# Patient Record
Sex: Male | Born: 1970 | Race: White | Hispanic: No | Smoking: Never smoker
Health system: Southern US, Community
[De-identification: ages and names within clinical notes are randomized; demographics above are authoritative.]

## PROBLEM LIST (undated history)

## (undated) DIAGNOSIS — K5090 Crohn's disease, unspecified, without complications: Secondary | ICD-10-CM

## (undated) DIAGNOSIS — G4730 Sleep apnea, unspecified: Secondary | ICD-10-CM

## (undated) DIAGNOSIS — H469 Unspecified optic neuritis: Secondary | ICD-10-CM

## (undated) DIAGNOSIS — T7840XA Allergy, unspecified, initial encounter: Secondary | ICD-10-CM

## (undated) DIAGNOSIS — K635 Polyp of colon: Secondary | ICD-10-CM

## (undated) DIAGNOSIS — F419 Anxiety disorder, unspecified: Secondary | ICD-10-CM

## (undated) DIAGNOSIS — I2699 Other pulmonary embolism without acute cor pulmonale: Secondary | ICD-10-CM

## (undated) HISTORY — PX: COLONOSCOPY: SHX174

## (undated) HISTORY — PX: POLYPECTOMY: SHX149

## (undated) HISTORY — DX: Unspecified optic neuritis: H46.9

## (undated) HISTORY — DX: Crohn's disease, unspecified, without complications: K50.90

## (undated) HISTORY — DX: Sleep apnea, unspecified: G47.30

## (undated) HISTORY — DX: Polyp of colon: K63.5

## (undated) HISTORY — DX: Allergy, unspecified, initial encounter: T78.40XA

## (undated) HISTORY — DX: Anxiety disorder, unspecified: F41.9

## (undated) HISTORY — PX: OTHER SURGICAL HISTORY: SHX169

## (undated) HISTORY — DX: Other pulmonary embolism without acute cor pulmonale: I26.99

---

## 2000-09-21 ENCOUNTER — Other Ambulatory Visit: Admission: RE | Admit: 2000-09-21 | Discharge: 2000-09-21 | Payer: Self-pay | Admitting: Internal Medicine

## 2000-09-21 ENCOUNTER — Encounter (INDEPENDENT_AMBULATORY_CARE_PROVIDER_SITE_OTHER): Payer: Self-pay | Admitting: Specialist

## 2000-10-18 ENCOUNTER — Ambulatory Visit (HOSPITAL_COMMUNITY): Admission: RE | Admit: 2000-10-18 | Discharge: 2000-10-18 | Payer: Self-pay | Admitting: Internal Medicine

## 2000-10-18 ENCOUNTER — Encounter: Payer: Self-pay | Admitting: Internal Medicine

## 2004-03-07 ENCOUNTER — Ambulatory Visit: Payer: Self-pay | Admitting: Family Medicine

## 2004-03-28 ENCOUNTER — Ambulatory Visit: Payer: Self-pay | Admitting: Family Medicine

## 2004-05-12 ENCOUNTER — Ambulatory Visit: Payer: Self-pay | Admitting: Family Medicine

## 2004-05-26 ENCOUNTER — Ambulatory Visit: Payer: Self-pay | Admitting: Family Medicine

## 2004-06-11 ENCOUNTER — Ambulatory Visit: Payer: Self-pay | Admitting: Family Medicine

## 2004-10-10 ENCOUNTER — Ambulatory Visit: Payer: Self-pay | Admitting: Family Medicine

## 2004-12-05 ENCOUNTER — Ambulatory Visit: Payer: Self-pay | Admitting: Family Medicine

## 2005-10-28 ENCOUNTER — Ambulatory Visit: Payer: Self-pay | Admitting: Internal Medicine

## 2007-05-31 ENCOUNTER — Encounter: Admission: RE | Admit: 2007-05-31 | Discharge: 2007-05-31 | Payer: Self-pay | Admitting: Family Medicine

## 2007-12-30 ENCOUNTER — Encounter: Admission: RE | Admit: 2007-12-30 | Discharge: 2007-12-30 | Payer: Self-pay | Admitting: Internal Medicine

## 2008-03-02 DIAGNOSIS — I2699 Other pulmonary embolism without acute cor pulmonale: Secondary | ICD-10-CM

## 2008-03-02 HISTORY — DX: Other pulmonary embolism without acute cor pulmonale: I26.99

## 2010-06-17 ENCOUNTER — Telehealth: Payer: Self-pay | Admitting: Internal Medicine

## 2010-06-17 ENCOUNTER — Emergency Department (HOSPITAL_COMMUNITY)
Admission: EM | Admit: 2010-06-17 | Discharge: 2010-06-17 | Disposition: A | Discharge status: Home / Self Care | Payer: Managed Care, Other (non HMO) | Attending: Emergency Medicine | Admitting: Emergency Medicine

## 2010-06-17 DIAGNOSIS — R109 Unspecified abdominal pain: Secondary | ICD-10-CM | POA: Insufficient documentation

## 2010-06-17 DIAGNOSIS — K5090 Crohn's disease, unspecified, without complications: Secondary | ICD-10-CM | POA: Insufficient documentation

## 2010-06-17 DIAGNOSIS — Z7901 Long term (current) use of anticoagulants: Secondary | ICD-10-CM | POA: Insufficient documentation

## 2010-06-17 DIAGNOSIS — Z86718 Personal history of other venous thrombosis and embolism: Secondary | ICD-10-CM | POA: Insufficient documentation

## 2010-06-17 LAB — URINALYSIS, ROUTINE W REFLEX MICROSCOPIC
Bilirubin Urine: NEGATIVE
Glucose, UA: NEGATIVE mg/dL
Hgb urine dipstick: NEGATIVE
Ketones, ur: NEGATIVE mg/dL
Nitrite: NEGATIVE
Protein, ur: NEGATIVE mg/dL
Specific Gravity, Urine: 1.017 (ref 1.005–1.030)
Urobilinogen, UA: 0.2 mg/dL (ref 0.0–1.0)
pH: 6 (ref 5.0–8.0)

## 2010-06-17 LAB — CBC
HCT: 48.7 % (ref 39.0–52.0)
Hemoglobin: 16.3 g/dL (ref 13.0–17.0)
MCH: 28 pg (ref 26.0–34.0)
MCHC: 33.5 g/dL (ref 30.0–36.0)
MCV: 83.5 fL (ref 78.0–100.0)
Platelets: 181 10*3/uL (ref 150–400)
RBC: 5.83 MIL/uL — ABNORMAL HIGH (ref 4.22–5.81)
RDW: 13.4 % (ref 11.5–15.5)
WBC: 8.4 10*3/uL (ref 4.0–10.5)

## 2010-06-17 LAB — DIFFERENTIAL
Basophils Absolute: 0 10*3/uL (ref 0.0–0.1)
Basophils Relative: 0 % (ref 0–1)
Eosinophils Absolute: 0 10*3/uL (ref 0.0–0.7)
Eosinophils Relative: 0 % (ref 0–5)
Lymphocytes Relative: 13 % (ref 12–46)
Lymphs Abs: 1.1 10*3/uL (ref 0.7–4.0)
Monocytes Absolute: 0.4 10*3/uL (ref 0.1–1.0)
Monocytes Relative: 5 % (ref 3–12)
Neutro Abs: 6.9 10*3/uL (ref 1.7–7.7)
Neutrophils Relative %: 82 % — ABNORMAL HIGH (ref 43–77)

## 2010-06-17 LAB — COMPREHENSIVE METABOLIC PANEL
CO2: 28 mEq/L (ref 19–32)
Calcium: 9.3 mg/dL (ref 8.4–10.5)
Creatinine, Ser: 1.2 mg/dL (ref 0.4–1.5)
GFR calc non Af Amer: >60 mL/min (ref >60)
Glucose, Bld: 103 mg/dL — ABNORMAL HIGH (ref 70–99)

## 2010-06-17 LAB — OCCULT BLOOD, POC DEVICE: Fecal Occult Bld: NEGATIVE

## 2010-06-17 NOTE — Telephone Encounter (Signed)
Pt given an appt to see Dr. Henrene Pastor 06/19/10@9 :15am. Pt aware of appt date and time.

## 2010-06-17 NOTE — Telephone Encounter (Signed)
Chart ordered for review by Dr. Marina Goodell.

## 2010-06-19 ENCOUNTER — Telehealth: Payer: Self-pay | Admitting: Internal Medicine

## 2010-06-19 ENCOUNTER — Ambulatory Visit (INDEPENDENT_AMBULATORY_CARE_PROVIDER_SITE_OTHER): Payer: Managed Care, Other (non HMO) | Admitting: Internal Medicine

## 2010-06-19 ENCOUNTER — Encounter: Payer: Self-pay | Admitting: Internal Medicine

## 2010-06-19 VITALS — BP 120/80 | HR 74 | Ht 72.0 in | Wt 301.8 lb

## 2010-06-19 DIAGNOSIS — R1084 Generalized abdominal pain: Secondary | ICD-10-CM

## 2010-06-19 DIAGNOSIS — K5000 Crohn's disease of small intestine without complications: Secondary | ICD-10-CM | POA: Insufficient documentation

## 2010-06-19 NOTE — Telephone Encounter (Signed)
Erro. No call

## 2010-06-19 NOTE — Progress Notes (Signed)
HISTORY OF PRESENT ILLNESS:  Austin Mclean is a 40 y.o. 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 The patient was last seen by me in 2003, but has subsequently been lost to followup. In summary, he was diagnosed with Crohn's disease while living in [place] in 1997. He was also seen at [facility] by Dr. Christene Slates. I initially saw the patient in July of 2002. Colonoscopy was performed that same month. He was found to have a stenotic ileocecal valve with pseudopolyps consistent with ileal Crohn's disease. Normal colon. Treated initially with Cipro and Asacol. Subsequently treated with Entocort with discontinuation of Asacol. Lost to followup at that time. He tells me that he has been on no medications for over 8 years and doing "well". However, he describes episodic problems with right lower quadrant abdominal pain, change in bowel habits, and the need to restrict his diet. No problems with vomiting, fever, bleeding, or weight loss. He was seen at [facility] about 2 and half years ago by Dr. Holli Humbles, hematologist, for workup of blood clotting disorder. Workup reportedly negative. However advised to take chronic Coumadin. He then saw Dr. Jeanne Ivan, gastroenterologist, and apparently underwent colonoscopy about 4 months ago. Report has been requested. Apparently no colonic disease. Advised to continue without medical therapy. 4 days ago developed abdominal pain. This progressed and became quite severe. No other associated symptoms. He presented to the emergency room 2 days ago. His pain gradually improved nonspecifically. I have reviewed the emergency room record and spoke to the emergency room physician. His urinalysis was normal. CBC, including hemoglobin of 16.3, comprehensive metabolic panel, and stool Hemoccults from normal or negative. His INR was 1.88. His family doctor, Dr. Maudie Mercury monitors his INR. The patient  reinitiated Entocort, 9 mg daily, several days ago. This followup appointment arranged. Currently states that he is feeling well and back to baseline.  REVIEW OF SYSTEMS:  All non-GI ROS negative except for anxiety  Past Medical History  Diagnosis Date  . Crohn disease   . Pulmonary embolism 2010    bilateral    Past Surgical History  Procedure Date  . Unremarkable     Social History Austin Mclean  reports that he has quit smoking. He does not have any smokeless tobacco history on file. He reports that he drinks alcohol. He reports that he does not use illicit drugs.  family history is negative for Colon cancer.  No Known Allergies     PHYSICAL EXAMINATION: Vital signs: BP 120/80  Pulse 74  Ht 6' (1.829 m)  Wt 301 lb 12.8 oz (136.896 kg)  BMI 40.93 kg/m2  Constitutional: generally well-appearing, no acute distress Psychiatric: alert and oriented x3, cooperative Eyes: extraocular movements intact, anicteric, conjunctiva pink Mouth: oral pharynx moist, no lesions Neck: supple no lymphadenopathy Cardiovascular: heart regular rate and rhythm, no murmur Lungs: clear to auscultation bilaterally Abdomen: soft, nontender, nondistended, no obvious ascites, no peritoneal signs, normal bowel sounds, no organomegaly Rectal: Deferred. Hemoccult-negative stool in the ER Extremities: no lower extremity edema bilaterally Skin: no lesions on visible extremities Neuro: No focal deficits.    ASSESSMENT:  #1. Ileal Crohn's disease. Intermittent abdominal symptoms likely due to either burned-out Crohn's disease and scarring with intermittent obstruction and/or the same with an intermittent inflammatory component.  #2. History of bilateral pulmonary emboli. On chronic Coumadin therapy  PLAN:  #1. Continue recently started Entocort 9 mg daily #  2. We discussed possible CT scan enterography to further assess his small bowel and possibly guide therapy in the future #3. Obtain outside  colonoscopy report from [place] #4. Routine GI followup in 2 months. Contact the office in the interim for any questions or problems.

## 2010-06-19 NOTE — Patient Instructions (Signed)
Follow-up in 2 months. We will get records from [facility] for Dr. Marina Goodell to review.

## 2010-06-20 ENCOUNTER — Encounter: Payer: Self-pay | Admitting: Internal Medicine

## 2010-07-01 ENCOUNTER — Encounter: Payer: Self-pay | Admitting: Internal Medicine

## 2010-08-20 ENCOUNTER — Ambulatory Visit (INDEPENDENT_AMBULATORY_CARE_PROVIDER_SITE_OTHER): Payer: Managed Care, Other (non HMO) | Admitting: Internal Medicine

## 2010-08-20 ENCOUNTER — Encounter: Payer: Self-pay | Admitting: Internal Medicine

## 2010-08-20 VITALS — BP 118/80 | HR 80 | Ht 73.0 in | Wt 306.2 lb

## 2010-08-20 DIAGNOSIS — K5000 Crohn's disease of small intestine without complications: Secondary | ICD-10-CM

## 2010-08-20 NOTE — Progress Notes (Signed)
HISTORY OF PRESENT ILLNESS:  Austin Mclean is a 40 y.o. male with ileal Crohn's disease and DVT with bilateral pulmonary embolus for which he is on chronic Coumadin. I saw the patient for the first time since 2003, 2 months ago. See that dictation for details. Just prior to that visit he was seen in the emergency room for abdominal pain. He was empirically placed on budesonide 9 mg daily which he took for one month. No medication for one month. Routine followup today. I did obtain the outside colonoscopy report from [facility]. Examination was performed 09/24/2009 and revealed pseudopolyps in the cecum/ileocecal valve region. No ileal intubation achieved the colon was grossly normal. Pathology obtained (not yet obtained for review). The patient reports that since his office visit he has done well. No bowel or abdominal complaints.  REVIEW OF SYSTEMS:  All non-GI ROS negative.  Past Medical History  Diagnosis Date  . Crohn disease   . Pulmonary embolism 2010    bilateral    Past Surgical History  Procedure Date  . Unremarkable     Social History Austin Mclean  reports that he has quit smoking. He has never used smokeless tobacco. He reports that he drinks alcohol. He reports that he does not use illicit drugs.  family history is negative for Colon cancer.  No Known Allergies     PHYSICAL EXAMINATION: Vital signs: BP 118/80  Pulse 80  Ht 6' 1"  (1.854 m)  Wt 306 lb 3.2 oz (138.891 kg)  BMI 40.40 kg/m2 General: Well-developed, well-nourished, no acute distress HEENT: Sclerae are anicteric, conjunctiva pink. Oral mucosa intact Lungs: Clear Heart: Regular Abdomen: soft, nontender, nondistended, no obvious ascites, no peritoneal signs, normal bowel sounds. No organomegaly. Extremities: No edema Psychiatric: alert and oriented x3. Cooperative      ASSESSMENT:  #1. Ileal Crohn's. Recent problem with abdominal pain likely due to partial obstruction from burned out disease versus a  superimposed inflammatory component. Currently asymptomatic off medication. Last colonoscopy one year previous without obvious active Crohn's. #2. History of DVT with PE on Coumadin   PLAN:  #1. Continue active surveillance #2. Routine office followup 6 months #3. Repeat colonoscopy July 2013 #4. Obtain pathology from [place], colonoscopy with biopsies July 2011

## 2010-08-20 NOTE — Patient Instructions (Signed)
Follow up in 6 months 

## 2012-03-04 ENCOUNTER — Other Ambulatory Visit: Payer: Self-pay | Admitting: Internal Medicine

## 2012-03-04 DIAGNOSIS — R7401 Elevation of levels of liver transaminase levels: Secondary | ICD-10-CM

## 2012-03-11 ENCOUNTER — Other Ambulatory Visit: Payer: Managed Care, Other (non HMO)

## 2012-03-18 ENCOUNTER — Inpatient Hospital Stay: Admission: RE | Admit: 2012-03-18 | Payer: Managed Care, Other (non HMO) | Source: Ambulatory Visit

## 2012-03-25 ENCOUNTER — Other Ambulatory Visit: Payer: Managed Care, Other (non HMO)

## 2012-04-06 ENCOUNTER — Institutional Professional Consult (permissible substitution): Payer: Managed Care, Other (non HMO) | Admitting: Pulmonary Disease

## 2012-04-28 ENCOUNTER — Institutional Professional Consult (permissible substitution): Payer: Managed Care, Other (non HMO) | Admitting: Pulmonary Disease

## 2012-05-19 ENCOUNTER — Encounter: Payer: Self-pay | Admitting: Pulmonary Disease

## 2012-05-20 ENCOUNTER — Encounter: Payer: Self-pay | Admitting: Pulmonary Disease

## 2012-05-20 ENCOUNTER — Ambulatory Visit (INDEPENDENT_AMBULATORY_CARE_PROVIDER_SITE_OTHER): Payer: BC Managed Care – PPO | Admitting: Pulmonary Disease

## 2012-05-20 VITALS — BP 128/88 | HR 79 | Temp 97.6°F | Ht 72.0 in | Wt 338.8 lb

## 2012-05-20 DIAGNOSIS — G4733 Obstructive sleep apnea (adult) (pediatric): Secondary | ICD-10-CM | POA: Insufficient documentation

## 2012-05-20 NOTE — Patient Instructions (Addendum)
Will set up for home sleep testing, and will call once the results are available. Work on weight loss

## 2012-05-20 NOTE — Progress Notes (Signed)
Subjective:    Patient ID: Austin Mclean, male    DOB: May 14, 1970, 42 y.o.   MRN: [id number]  HPI The patient is a 42 year old male who I've been asked to see for possible obstructive sleep apnea.  The patient has been noted to have loud snoring as well as witnessed apneas his spouse.  He is not rested in the mornings upon arising, and has significant sleep pressure during the day with inactivity.  He feels that his alertness in the evening is adequate, but he stays extremely busy.  He has also noted some sleepiness with driving, and it is starting to scare him.  Of note, his weight is up 35 pounds over the last 2 years, and his Epworth score today is 12.  Sleep Questionnaire What time do you typically go to bed?( Between what hours) [id number] 1130-1230 at 0956 on 05/20/12 by Nita Sells, CMA How long does it take you to fall asleep? at most at most at 0956 on 05/20/12 by Nita Sells, CMA How many times during the night do you wake up? 0 0 at 0956 on 05/20/12 by Nita Sells, CMA What time do you get out of bed to start your day? 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 (In pounds) 35 lb (15.876 kg)35 lb (15.876 kg) increase at 0956 on 05/20/12 by Nita Sells, CMA Have you ever had a sleep study before? No No at 0956 on 05/20/12 by Nita Sells, CMA Do you currently use CPAP? No No at 0956 on 05/20/12 by Marjo Bicker Mabe, CMA Do you wear oxygen at any time? No No at 0956 on 05/20/12 by Nita Sells, CMA    Review of Systems  Constitutional: Negative for fever and unexpected weight change.  HENT: Negative for ear pain, nosebleeds, congestion, sore throat, rhinorrhea, sneezing, trouble swallowing, dental problem, postnasal drip and  sinus pressure.   Eyes: Negative for redness and itching.  Respiratory: Positive for cough ( occasional dry cough ). Negative for chest tightness, shortness of breath and wheezing.   Cardiovascular: Negative for palpitations and leg swelling.  Gastrointestinal: Negative for nausea and vomiting.  Genitourinary: Negative for dysuria.  Musculoskeletal: Negative for joint swelling.  Skin: Negative for rash.  Neurological: Negative for headaches.  Hematological: Does not bruise/bleed easily.  Psychiatric/Behavioral: Negative for dysphoric mood. The patient is not nervous/anxious.        Objective:   Physical Exam Constitutional:  Morbidly obese male, no acute distress  HENT:  Nares patent without discharge  Oropharynx without exudate, palate and uvula are normal  Eyes:  Perrla, eomi, no scleral icterus  Neck:  No JVD, no TMG  Cardiovascular:  Normal rate, regular rhythm, no rubs or gallops.  No murmurs        Intact distal pulses  Pulmonary :  Normal breath sounds, no stridor or respiratory distress   No rales, rhonchi, or wheezing  Abdominal:  Soft, nondistended, bowel sounds present.  No tenderness noted.   Musculoskeletal:  No lower extremity edema noted.  Lymph Nodes:  No cervical lymphadenopathy noted  Skin:  No cyanosis noted  Neurologic:  Appears sleepy, appropriate, moves all 4 extremities without obvious deficit.  Assessment & Plan:

## 2012-05-20 NOTE — Assessment & Plan Note (Signed)
The patient's history is classic for clinically significant obstructive sleep apnea.  I had a long discussion with him about sleep apnea, including its impact to his quality of life cardiovascular health.  He obviously needs to have a sleep study done, and is an excellent candidate for home sleep testing.  I have encouraged him to work aggressively on weight loss, and reminded him of his moral responsibility to not drive if he is sleepy.  I will arrange followup once his sleep study results are available.

## 2012-05-25 ENCOUNTER — Ambulatory Visit (INDEPENDENT_AMBULATORY_CARE_PROVIDER_SITE_OTHER): Payer: BC Managed Care – PPO | Admitting: Pulmonary Disease

## 2012-05-25 DIAGNOSIS — G4733 Obstructive sleep apnea (adult) (pediatric): Secondary | ICD-10-CM

## 2012-06-09 ENCOUNTER — Telehealth: Payer: Self-pay | Admitting: Pulmonary Disease

## 2012-06-09 ENCOUNTER — Other Ambulatory Visit: Payer: Self-pay | Admitting: Pulmonary Disease

## 2012-06-09 DIAGNOSIS — G4733 Obstructive sleep apnea (adult) (pediatric): Secondary | ICD-10-CM

## 2012-06-09 NOTE — Telephone Encounter (Signed)
Libby, did you call this patient?? [place], CMA

## 2012-06-09 NOTE — Telephone Encounter (Signed)
Yes i did call him and i lmtcb just now Austin Mclean

## 2012-06-09 NOTE — Telephone Encounter (Signed)
Order refaxed to [facility] per pt requies Joellen Jersey

## 2012-07-22 ENCOUNTER — Ambulatory Visit (INDEPENDENT_AMBULATORY_CARE_PROVIDER_SITE_OTHER): Payer: BC Managed Care – PPO | Admitting: Pulmonary Disease

## 2012-07-22 ENCOUNTER — Encounter: Payer: Self-pay | Admitting: Pulmonary Disease

## 2012-07-22 VITALS — BP 130/90 | HR 95 | Temp 98.1°F | Wt 346.0 lb

## 2012-07-22 DIAGNOSIS — G4733 Obstructive sleep apnea (adult) (pediatric): Secondary | ICD-10-CM

## 2012-07-22 NOTE — Progress Notes (Signed)
  Subjective:    Patient ID: Austin Mclean, male    DOB: 1970/04/16, 42 y.o.   MRN: [id number]  HPI The patient comes in today for followup of his obstructive sleep apnea.  He is wearing CPAP at night, and has seen definite improvement in his sleep and daytime alertness.  He is having no issues with his mask fit or pressure.   Review of Systems  Constitutional: Negative for fever and unexpected weight change.  HENT: Positive for postnasal drip. Negative for ear pain, nosebleeds, congestion, sore throat, rhinorrhea, sneezing, trouble swallowing, dental problem and sinus pressure.   Eyes: Negative for redness and itching.  Respiratory: Positive for cough. Negative for chest tightness, shortness of breath and wheezing.   Cardiovascular: Negative for palpitations and leg swelling.  Gastrointestinal: Negative for nausea and vomiting.  Genitourinary: Negative for dysuria.  Musculoskeletal: Negative for joint swelling.  Skin: Negative for rash.  Neurological: Negative for headaches.  Hematological: Does not bruise/bleed easily.  Psychiatric/Behavioral: Negative for dysphoric mood. The patient is not nervous/anxious.        Objective:   Physical Exam Obese male in no acute distress Nose without purulence or discharge noted No skin breakdown or pressure necrosis from the CPAP mask Neck without lymphadenopathy or thyromegaly Lower extremities without edema, no cyanosis  alert, does not appear to be sleepy, moves all 4 extremities.       Assessment & Plan:

## 2012-07-22 NOTE — Assessment & Plan Note (Signed)
The patient is doing very well on CPAP, and has seen a considerable improvement in his sleep and daytime alertness.  He is having no mask or pressure tolerates issues.  At this point, we need to optimize his pressure, and we will do this on the automatic setting at home.  I have also encouraged him to work aggressively on weight loss. Care Plan:  At this point, will arrange for the patient's machine to be changed over to auto mode for 2 weeks to optimize their pressure.  I will review the downloaded data once sent by dme, and also evaluate for compliance, leaks, and residual osa.  I will call the patient and dme to discuss the results, and have the patient's machine set appropriately.  This will serve as the pt's cpap pressure titration.

## 2012-07-22 NOTE — Patient Instructions (Addendum)
Will have your machine set on automatic mode to optimize your pressure.  Will call you with results. Work on weight loss, keep up with mask changes and supplies. followup with me in 69mo if doing well.

## 2012-08-22 ENCOUNTER — Telehealth: Payer: Self-pay | Admitting: Pulmonary Disease

## 2012-08-22 DIAGNOSIS — G4733 Obstructive sleep apnea (adult) (pediatric): Secondary | ICD-10-CM

## 2012-08-22 NOTE — Telephone Encounter (Signed)
Orders placed for mask and supply lmom

## 2012-11-12 ENCOUNTER — Other Ambulatory Visit: Payer: Self-pay | Admitting: Pulmonary Disease

## 2012-11-12 DIAGNOSIS — G4733 Obstructive sleep apnea (adult) (pediatric): Secondary | ICD-10-CM

## 2013-01-20 ENCOUNTER — Ambulatory Visit: Payer: BC Managed Care – PPO | Admitting: Pulmonary Disease

## 2013-02-20 ENCOUNTER — Other Ambulatory Visit (INDEPENDENT_AMBULATORY_CARE_PROVIDER_SITE_OTHER): Payer: Self-pay | Admitting: Otolaryngology

## 2013-02-20 DIAGNOSIS — R221 Localized swelling, mass and lump, neck: Secondary | ICD-10-CM

## 2013-02-21 ENCOUNTER — Ambulatory Visit
Admission: RE | Admit: 2013-02-21 | Discharge: 2013-02-21 | Disposition: A | Discharge status: Home / Self Care | Payer: BC Managed Care – PPO | Source: Ambulatory Visit | Attending: Otolaryngology | Admitting: Otolaryngology

## 2013-02-21 DIAGNOSIS — R221 Localized swelling, mass and lump, neck: Secondary | ICD-10-CM

## 2013-02-21 MED ORDER — IOHEXOL 300 MG/ML  SOLN
100.0000 mL | Freq: Once | INTRAMUSCULAR | Status: AC | PRN
  Administered 2013-02-21: 100 mL via INTRAVENOUS

## 2013-03-10 ENCOUNTER — Ambulatory Visit (INDEPENDENT_AMBULATORY_CARE_PROVIDER_SITE_OTHER): Payer: Managed Care, Other (non HMO) | Admitting: Pulmonary Disease

## 2013-03-10 ENCOUNTER — Encounter: Payer: Self-pay | Admitting: Pulmonary Disease

## 2013-03-10 VITALS — BP 128/82 | HR 83 | Temp 98.1°F | Ht 72.75 in | Wt 343.8 lb

## 2013-03-10 DIAGNOSIS — G4733 Obstructive sleep apnea (adult) (pediatric): Secondary | ICD-10-CM

## 2013-03-10 DIAGNOSIS — Z23 Encounter for immunization: Secondary | ICD-10-CM

## 2013-03-10 NOTE — Assessment & Plan Note (Signed)
The patient is doing well with CPAP, but continues to have intermittent mask leak issues. He has not kept up with his new cushions, and perhaps he would also do better on the automatic setting. I will have his machine put on that level, and he will see about getting new supplies. Finally, I have encouraged him to work aggressively on weight loss.

## 2013-03-10 NOTE — Patient Instructions (Signed)
Will send an order to your equipment company to get new mask seals, and put you back on auto to see if more comfortable for you. Work on weight loss followup with me in one year if doing well.  Call if having issues.

## 2013-03-10 NOTE — Progress Notes (Signed)
   Subjective:    Patient ID: Austin Mclean, male    DOB: 1970-11-29, 43 y.o.   MRN: [id number]  HPI The patient comes in today for followup of his obstructive sleep apnea. He is wearing CPAP compliantly, but continues to have mask leak issues intermittently. However, he has not been keeping up with his cushion changes. He feels that he is sleeping fairly well with the device, and definitely feels better during the day. His weight is stable from the last visit.   Review of Systems  Constitutional: Negative for fever and unexpected weight change.  HENT: Negative for congestion, dental problem, ear pain, nosebleeds, postnasal drip, rhinorrhea, sinus pressure, sneezing, sore throat and trouble swallowing.   Eyes: Negative for redness and itching.  Respiratory: Negative for cough, chest tightness, shortness of breath and wheezing.   Cardiovascular: Negative for palpitations and leg swelling.  Gastrointestinal: Negative for nausea and vomiting.  Genitourinary: Negative for dysuria.  Musculoskeletal: Negative for joint swelling.  Skin: Negative for rash.  Neurological: Negative for headaches.  Hematological: Does not bruise/bleed easily.  Psychiatric/Behavioral: Negative for dysphoric mood. The patient is not nervous/anxious.        Objective:   Physical Exam Obese male in no acute distress Nose without purulence or discharge noted No skin breakdown or pressure necrosis from the CPAP mask Neck without lymphadenopathy or thyromegaly Lower extremities without edema, no cyanosis Alert and oriented, moves all 4 extremities.       Assessment & Plan:

## 2014-03-12 ENCOUNTER — Ambulatory Visit: Payer: Managed Care, Other (non HMO) | Admitting: Pulmonary Disease

## 2015-08-22 ENCOUNTER — Ambulatory Visit (INDEPENDENT_AMBULATORY_CARE_PROVIDER_SITE_OTHER): Payer: Managed Care, Other (non HMO) | Admitting: Internal Medicine

## 2015-08-22 ENCOUNTER — Encounter: Payer: Self-pay | Admitting: Internal Medicine

## 2015-08-22 VITALS — BP 132/80 | HR 68 | Ht 72.0 in | Wt 300.0 lb

## 2015-08-22 DIAGNOSIS — R1084 Generalized abdominal pain: Secondary | ICD-10-CM | POA: Diagnosis not present

## 2015-08-22 DIAGNOSIS — K5010 Crohn's disease of large intestine without complications: Secondary | ICD-10-CM | POA: Diagnosis not present

## 2015-08-22 DIAGNOSIS — Z7901 Long term (current) use of anticoagulants: Secondary | ICD-10-CM | POA: Diagnosis not present

## 2015-08-22 NOTE — Patient Instructions (Addendum)
You have been scheduled for a CT scan of the abdomen and pelvis at [place] ([address]---this is in the same building as [facility]).   You are scheduled on 09/05/2015 at 10:30am. You should arrive 15 minutes prior to your appointment time for registration. Please follow the written instructions below on the day of your exam:  WARNING: IF YOU ARE ALLERGIC TO IODINE/X-RAY DYE, PLEASE NOTIFY RADIOLOGY IMMEDIATELY AT [phone]! YOU WILL BE GIVEN A 13 HOUR PREMEDICATION PREP.  1) Do not eat or drink anything after 6:30am (4 hours prior to your test) 2) You have been given 2 bottles of oral contrast to drink. The solution may taste better if refrigerated, but do NOT add ice or any other liquid to this solution. Shake well before drinking.    Drink 1 bottle of contrast @ 8:30am (2 hours prior to your exam)  Drink 1 bottle of contrast @ 9:30am (1 hour prior to your exam)  You may take any medications as prescribed with a small amount of water except for the following: Metformin, Glucophage, Glucovance, Avandamet, Riomet, Fortamet, Actoplus Met, Janumet, Glumetza or Metaglip. The above medications must be held the day of the exam AND 48 hours after the exam.  The purpose of you drinking the oral contrast is to aid in the visualization of your intestinal tract. The contrast solution may cause some diarrhea. Before your exam is started, you will be given a small amount of fluid to drink. Depending on your individual set of symptoms, you may also receive an intravenous injection of x-ray contrast/dye. Plan on being at [facility] for 30 minutes or long, depending on the type of exam you are having performed.  If you have any questions regarding your exam or if you need to reschedule, you may call the CT department at [phone] between the hours of 8:00 am and 5:00 pm, Monday-Friday.  ________________________________________________________________________    Austin Mclean  have been scheduled for a colonoscopy. Please follow written instructions given to you at your visit today.  Please pick up your prep supplies at the pharmacy within the next 1-3 days. If you use inhalers (even only as needed), please bring them with you on the day of your procedure. Your physician has requested that you go to www.startemmi.com and enter the access code given to you at your visit today. This web site gives a general overview about your procedure. However, you should still follow specific instructions given to you by our office regarding your preparation for the procedure.

## 2015-08-22 NOTE — Progress Notes (Signed)
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 See the office note from April 2012 for details of his GI history. He was last seen in the office 08/20/2010 at which time he had had recent problems with abdominal pain felt secondary to partial obstruction from burned-out Crohn's disease versus fibrosed stenotic disease with superimposed inflammation. He had been given a one-month course of budesonide. Asymptomatic at time of his office evaluation. Recommendation was for continued active surveillance, routine follow-up in 6 months, and repeat colonoscopy July 2013. Fortunately, he has not been seen since. His last colonoscopy was performed at [facility] July 2011. The colon was normal grossly and microscopically. The ileum revealed stenosis and pseudopolyposis. Follow-up colonoscopy in 2 years with possible use of pediatric colonoscope was recommended. The patient was aware. He presents today with chief complaint of low midline abdominal/suprapubic discomfort. He saw his primary care physician, Dr. Harrington Challenger, and apparently had negative urinalysis and negative blood work. He was treated empirically with ciprofloxacin and metronidazole for 10 days for possible diverticulitis. Patient states that he felt poorly on antibiotics without significant change in his discomfort. Evaluation here recommended. He has had no nausea or vomiting. He is eating well without pain. Reports one to 2 formed bowel movements per day without blood. No change in his discomfort with either urination or defecation. No weight loss. Discomfort is increased with sitting. He continues on anticoagulation therapy.  REVIEW OF SYSTEMS:  All non-GI ROS negative except for sinus and allergy, anxiety, back pain, cough  Past Medical History  Diagnosis Date  . Crohn disease ([place])     inactive x 10 years  . Pulmonary  embolism ([place]) 2010    bilateral  . Optic neuritis   . Anxiety   . Colon polyps     pseudopolyps    Past Surgical History  Procedure Laterality Date  . Unremarkable      Social History BELA SOKOLOFF  reports that he quit smoking about 15 years ago. His smoking use included Cigarettes. He has a 1.5 pack-year smoking history. He has never used smokeless tobacco. He reports that he drinks alcohol. He reports that he does not use illicit drugs.  family history includes Cancer in his other; Diabetes type I in his cousin. There is no history of Colon cancer.  No Known Allergies     PHYSICAL EXAMINATION: Vital signs: BP 132/80 mmHg  Pulse 68  Ht 6' (1.829 m)  Wt 300 lb (136.079 kg)  BMI 40.68 kg/m2  Constitutional: Pleasant, obese, generally well-appearing, no acute distress Psychiatric: alert and oriented x3, cooperative Eyes: extraocular movements intact, anicteric, conjunctiva pink Mouth: oral pharynx moist, no lesions Neck: supple no lymphadenopathy Cardiovascular: heart regular rate and rhythm, no murmur Lungs: clear to auscultation bilaterally Abdomen: soft, obese, nontender, nondistended, no obvious ascites, no peritoneal signs, normal bowel sounds, no organomegaly Rectal: Deferred, Extremities: no clubbing cyanosis or lower extremity edema bilaterally Skin: no lesions on visible extremities Neuro: No focal deficits. Cranial nerves intact  ASSESSMENT:  #1. History of ileal Crohn's disease as previously documented #2. Recent problems with suprapubic/lower mid abdominal discomfort as described. Etiology unclear #3. History of DVT with bilateral ulnar embolus on Xarelto. Followed by Dr. Danton Sewer at [facility]:  #1. Contrast-enhanced CT scan of the abdomen and pelvisTo further evaluate pain #2. If no contraindication on CT surveillance colonoscopy.The  nature of the procedure, as well as the risks, benefits, and alternatives were carefully and thoroughly reviewed with  the patient. Ample time for discussion and questions allowed. The patient understood, was satisfied, and agreed to proceed. #3. Will need to address anticoagulation therapy with his hematologist if we proceed with colonoscopy.

## 2015-08-27 ENCOUNTER — Telehealth: Payer: Self-pay

## 2015-08-27 NOTE — Telephone Encounter (Signed)
Anticoagulation letter routed 6/27

## 2015-08-27 NOTE — Telephone Encounter (Signed)
  08/27/2015   RE: BARNARD SHARPS DOB: 04-06-1970 MRN: [id number]   Dear Dr. Harrington Challenger,    We have scheduled the above patient for an endoscopic procedure. Our records show that he is on anticoagulation therapy.   Please advise as to how long the patient may come off his therapy of Xarelto prior to the procedure, which is scheduled for 09/18/2015.  Please fax back/ or route the completed form to [place] at [phone].   Sincerely,    Phillis Haggis

## 2015-08-28 NOTE — Telephone Encounter (Signed)
Routing to patient's PCP, Dr. Harrington Challenger.

## 2015-08-28 NOTE — Telephone Encounter (Signed)
I do not see any record that I have seen pt before  ? Another physician.

## 2015-09-05 ENCOUNTER — Ambulatory Visit (INDEPENDENT_AMBULATORY_CARE_PROVIDER_SITE_OTHER)
Admission: RE | Admit: 2015-09-05 | Discharge: 2015-09-05 | Disposition: A | Discharge status: Home / Self Care | Payer: Managed Care, Other (non HMO) | Source: Ambulatory Visit | Attending: Internal Medicine | Admitting: Internal Medicine

## 2015-09-05 ENCOUNTER — Encounter (INDEPENDENT_AMBULATORY_CARE_PROVIDER_SITE_OTHER): Payer: Self-pay

## 2015-09-05 ENCOUNTER — Telehealth: Payer: Self-pay | Admitting: Internal Medicine

## 2015-09-05 DIAGNOSIS — R1084 Generalized abdominal pain: Secondary | ICD-10-CM | POA: Diagnosis not present

## 2015-09-05 DIAGNOSIS — K5010 Crohn's disease of large intestine without complications: Secondary | ICD-10-CM

## 2015-09-05 MED ORDER — IOPAMIDOL (ISOVUE-300) INJECTION 61%
100.0000 mL | Freq: Once | INTRAVENOUS | Status: AC | PRN
  Administered 2015-09-05: 100 mL via INTRAVENOUS

## 2015-09-05 MED ORDER — NA SULFATE-K SULFATE-MG SULF 17.5-3.13-1.6 GM/177ML PO SOLN: 1.0000 | Freq: Once | ORAL | Status: DC

## 2015-09-05 NOTE — Telephone Encounter (Signed)
Sent Suprep to pharmacy    Resent anticoagulation letter to Dr. Lona Kettle.

## 2015-09-10 NOTE — Telephone Encounter (Signed)
Left message for patient that, per Dr. Harrington Challenger, he could hold his Xarelto for 5 days prior to his procedure.  I requested a call back so that I knew he got my message.

## 2015-09-12 ENCOUNTER — Telehealth: Payer: Self-pay

## 2015-09-12 NOTE — Telephone Encounter (Signed)
Lm reiterating that patient, per Dr. Harrington Challenger, can hold his Xarelto for 5 days prior to his procedure scheduled for 7/19.  Requested he call back and just leave a quick message that he got and understood this information

## 2015-09-18 ENCOUNTER — Ambulatory Visit (AMBULATORY_SURGERY_CENTER): Payer: Managed Care, Other (non HMO) | Admitting: Internal Medicine

## 2015-09-18 ENCOUNTER — Encounter: Payer: Self-pay | Admitting: Internal Medicine

## 2015-09-18 VITALS — BP 117/82 | HR 64 | Temp 98.2°F | Resp 11 | Ht 72.0 in | Wt 300.0 lb

## 2015-09-18 DIAGNOSIS — K5010 Crohn's disease of large intestine without complications: Secondary | ICD-10-CM | POA: Diagnosis not present

## 2015-09-18 DIAGNOSIS — D214 Benign neoplasm of connective and other soft tissue of abdomen: Secondary | ICD-10-CM | POA: Diagnosis not present

## 2015-09-18 DIAGNOSIS — D120 Benign neoplasm of cecum: Secondary | ICD-10-CM

## 2015-09-18 DIAGNOSIS — K635 Polyp of colon: Secondary | ICD-10-CM | POA: Diagnosis not present

## 2015-09-18 MED ORDER — SODIUM CHLORIDE 0.9 % IV SOLN: 500.0000 mL | INTRAVENOUS | Status: DC

## 2015-09-18 NOTE — Progress Notes (Signed)
No egg or soy allergy known to patient  No issues with past sedation with any surgeries  or procedures, no intubation problems  No diet pills per patient No home 02 use per patient  Pt takes Xarelto- blood thinners per patient - last took 7-13 Thursday  Pt denies issues with constipation

## 2015-09-18 NOTE — Op Note (Signed)
[facility] Patient Name: Austin Mclean Procedure Date: 09/18/2015 7:37 AM MRN: [id number] Endoscopist: Docia Chuck. Henrene Pastor , MD Age: 45 Referring MD:  Date of Birth: [birth year] Gender: Male Account #: [id number] Procedure:                Colonoscopy, with biopsies Indications:              High risk colon cancer surveillance: Crohn's disease Medicines:                Monitored Anesthesia Care Procedure:                Pre-Anesthesia Assessment:                           - Prior to the procedure, a History and Physical                            was performed, and patient medications and                            allergies were reviewed. The patient's tolerance of                            previous anesthesia was also reviewed. The risks                            and benefits of the procedure and the sedation                            options and risks were discussed with the patient.                            All questions were answered, and informed consent                            was obtained. Prior Anticoagulants: The patient has                            taken Xarelto (rivaroxaban), last dose was 6 days                            prior to procedure. ASA Grade Assessment: II - A                            patient with mild systemic disease. After reviewing                            the risks and benefits, the patient was deemed in                            satisfactory condition to undergo the procedure.                           After obtaining informed consent, the colonoscope  was passed under direct vision. Throughout the                            procedure, the patient's blood pressure, pulse, and                            oxygen saturations were monitored continuously. The                            Model CF-HQ190L [id number]) scope was introduced                            through the anus and advanced to the the cecum,                   identified by appendiceal orifice and ileocecal                            valve. The terminal ileum, ileocecal valve,                            appendiceal orifice, and rectum were photographed.                            The quality of the bowel preparation was excellent.                            The colonoscopy was performed without difficulty.                            The patient tolerated the procedure well. The bowel                            preparation used was SUPREP. Scope In: 8:15:00 AM Scope Out: 8:32:49 AM Scope Withdrawal Time: 0 hours 15 minutes 28 seconds  Total Procedure Duration: 0 hours 17 minutes 49 seconds  Findings:                 The terminal ileum was intubated for 15 cm and                            appeared normal.                           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 However, no active inflammation.                            Biopsies of the pseudopolyps were taken for  confirmation.                           The exam of the colonic mucosa was otherwise                            without abnormality on direct and retroflexion                            views.                           Biopsies were taken with a cold forceps at the                            ileocecal valve for histology, as stated above. Complications:            No immediate complications. Estimated blood loss:                            None. Estimated Blood Loss:     Estimated blood loss: none. 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                            The ileum and colon were otherwise normal without                            evidence for active Crohn's disease.                           - Biopsies were taken with a cold forceps for                            histology  at the ileocecal valve. Recommendation:           - Repeat colonoscopy in 5 years for surveillance.                           - Resume Xarelto (rivaroxaban) today at prior dose.                           - Office follow-up with Dr. Henrene Pastor one year.                           - Resume previous diet.                           - Continue present medications.                           - Await pathology results. Docia Chuck. Henrene Pastor, MD 09/18/2015 8:54:24 AM This report has been signed electronically.

## 2015-09-18 NOTE — Patient Instructions (Signed)
Impression/recommendations:  Pseudopolyps Repeat colonoscopy in 5 years. Resume Xarelto today at prior dose. Office follow up with Dr. Henrene Pastor in 1 year.  YOU HAD AN ENDOSCOPIC PROCEDURE TODAY AT [facility]:   Refer to the procedure report that was given to you for any specific questions about what was found during the examination.  If the procedure report does not answer your questions, please call your gastroenterologist to clarify.  If you requested that your care partner not be given the details of your procedure findings, then the procedure report has been included in a sealed envelope for you to review at your convenience later.  YOU SHOULD EXPECT: Some feelings of bloating in the abdomen. Passage of more gas than usual.  Walking can help get rid of the air that was put into your GI tract during the procedure and reduce the bloating. If you had a lower endoscopy (such as a colonoscopy or flexible sigmoidoscopy) you may notice spotting of blood in your stool or on the toilet paper. If you underwent a bowel prep for your procedure, you may not have a normal bowel movement for a few days.  Please Note:  You might notice some irritation and congestion in your nose or some drainage.  This is from the oxygen used during your procedure.  There is no need for concern and it should clear up in a day or so.  SYMPTOMS TO REPORT IMMEDIATELY:   Following lower endoscopy (colonoscopy or flexible sigmoidoscopy):  Excessive amounts of blood in the stool  Significant tenderness or worsening of abdominal pains  Swelling of the abdomen that is new, acute  Fever of 100F or higher   For urgent or emergent issues, a gastroenterologist can be reached at any hour by calling [phone].   DIET: Your first meal following the procedure should be a small meal and then it is ok to progress to your normal diet. Heavy or fried foods are harder to digest and may make you feel nauseous or  bloated.  Likewise, meals heavy in dairy and vegetables can increase bloating.  Drink plenty of fluids but you should avoid alcoholic beverages for 24 hours.  ACTIVITY:  You should plan to take it easy for the rest of today and you should NOT DRIVE or use heavy machinery until tomorrow (because of the sedation medicines used during the test).    FOLLOW UP: Our staff will call the number listed on your records the next business day following your procedure to check on you and address any questions or concerns that you may have regarding the information given to you following your procedure. If we do not reach you, we will leave a message.  However, if you are feeling well and you are not experiencing any problems, there is no need to return our call.  We will assume that you have returned to your regular daily activities without incident.  If any biopsies were taken you will be contacted by phone or by letter within the next 1-3 weeks.  Please call us at [phone] if you have not heard about the biopsies in 3 weeks.    SIGNATURES/CONFIDENTIALITY: You and/or your care partner have signed paperwork which will be entered into your electronic medical record.  These signatures attest to the fact that that the information above on your After Visit Summary has been reviewed and is understood.  Full responsibility of the confidentiality of this discharge information lies with you and/or your care-partner.

## 2015-09-18 NOTE — Progress Notes (Signed)
Report to PACU, RN, vss, BBS= Clear.  

## 2015-09-18 NOTE — Progress Notes (Signed)
Called to room to assist during endoscopic procedure.  Patient ID and intended procedure confirmed with present staff. Received instructions for my participation in the procedure from the performing physician.  

## 2015-09-19 ENCOUNTER — Telehealth: Payer: Self-pay | Admitting: *Deleted

## 2015-09-19 NOTE — Telephone Encounter (Signed)
Name identifier, left message, follow-up

## 2015-09-26 ENCOUNTER — Encounter: Payer: Self-pay | Admitting: Internal Medicine

## 2016-09-22 ENCOUNTER — Encounter: Payer: Self-pay | Admitting: Internal Medicine

## 2016-10-15 ENCOUNTER — Other Ambulatory Visit: Payer: Self-pay | Admitting: Orthopedic Surgery

## 2016-10-15 DIAGNOSIS — M545 Low back pain, unspecified: Secondary | ICD-10-CM

## 2016-10-24 ENCOUNTER — Ambulatory Visit
Admission: RE | Admit: 2016-10-24 | Discharge: 2016-10-24 | Disposition: A | Discharge status: Home / Self Care | Payer: Managed Care, Other (non HMO) | Source: Ambulatory Visit | Attending: Orthopedic Surgery | Admitting: Orthopedic Surgery

## 2016-10-24 DIAGNOSIS — M545 Low back pain, unspecified: Secondary | ICD-10-CM

## 2018-02-08 ENCOUNTER — Ambulatory Visit (INDEPENDENT_AMBULATORY_CARE_PROVIDER_SITE_OTHER): Payer: 59 | Admitting: Pulmonary Disease

## 2018-02-08 ENCOUNTER — Encounter: Payer: Self-pay | Admitting: Pulmonary Disease

## 2018-02-08 ENCOUNTER — Ambulatory Visit (INDEPENDENT_AMBULATORY_CARE_PROVIDER_SITE_OTHER)
Admission: RE | Admit: 2018-02-08 | Discharge: 2018-02-08 | Disposition: A | Discharge status: Home / Self Care | Payer: 59 | Source: Ambulatory Visit | Attending: Pulmonary Disease | Admitting: Pulmonary Disease

## 2018-02-08 VITALS — BP 110/84 | HR 85 | Ht 72.0 in | Wt 300.0 lb

## 2018-02-08 DIAGNOSIS — R0602 Shortness of breath: Secondary | ICD-10-CM | POA: Diagnosis not present

## 2018-02-08 MED ORDER — MONTELUKAST SODIUM 10 MG PO TABS: 10.0000 mg | ORAL_TABLET | Freq: Every day | ORAL | 11 refills | Status: DC

## 2018-02-08 NOTE — Progress Notes (Signed)
Austin Mclean    [id number]    February 22, 1971  Primary Care Physician:Ross, Dwyane Luo, MD  Referring Physician: [name], MD [place], [place] [address]  Chief complaint:   Shortness of breath-intermittently Concern about awareness of his breathing on some occasions  HPI:  Past history of pulmonary embolism for which is been on Xarelto Shortness of breath sometimes at rest Exercises regularly with no significant shortness of breath or chest pain or discomfort History of obstructive sleep apnea-compliant with CPAP use Denies any chest pains or chest discomfort  No history of heart disease  Sleeps well, functions well  Outpatient Encounter Medications as of 02/08/2018  Medication Sig  . buPROPion (ZYBAN) 150 MG 12 hr tablet Take 150 mg by mouth 2 (two) times daily.  . rivaroxaban (XARELTO) 10 MG TABS tablet Take 10 mg by mouth daily.   No facility-administered encounter medications on file as of 02/08/2018.     Allergies as of 02/08/2018  . (No Known Allergies)    Past Medical History:  Diagnosis Date  . Allergy   . Anxiety   . Colon polyps    pseudopolyps  . Crohn disease ([place])    inactive x 10 years  . Optic neuritis   . Pulmonary embolism ([place]) 2010   bilateral  . Sleep apnea     Past Surgical History:  Procedure Laterality Date  . COLONOSCOPY    . POLYPECTOMY    . unremarkable      Family History  Problem Relation Age of Onset  . Colon cancer Neg Hx   . Colon polyps Neg Hx   . Rectal cancer Neg Hx   . Stomach cancer Neg Hx   . Diabetes type I Cousin   . Cancer Other        grandparent?    Social History   Socioeconomic History  . Marital status: Married    Spouse name: Not on file  . Number of children: 2  . Years of education: Not on file  . Highest education level: Not on file  Occupational History  . Occupation: Drug Stage manager: [employer]  . Financial  resource strain: Not on file  . Food insecurity:    Worry: Not on file    Inability: Not on file  . Transportation needs:    Medical: Not on file    Non-medical: Not on file  Tobacco Use  . Smoking status: Former Smoker    Packs/day: 0.50    Years: 3.00    Pack years: 1.50    Types: Cigarettes    Last attempt to quit: 03/02/2000    Years since quitting: 17.9  . Smokeless tobacco: Never Used  . Tobacco comment: only in college years---social/occassional  Substance and Sexual Activity  . Alcohol use: Yes    Alcohol/week: 0.0 standard drinks    Comment: very rare  . Drug use: No  . Sexual activity: Not on file  Lifestyle  . Physical activity:    Days per week: Not on file    Minutes per session: Not on file  . Stress: Not on file  Relationships  . Social connections:    Talks on phone: Not on file    Gets together: Not on file    Attends religious service: Not on file    Active member of club or organization: Not on file    Attends meetings of  clubs or organizations: Not on file    Relationship status: Not on file  . Intimate partner violence:    Fear of current or ex partner: Not on file    Emotionally abused: Not on file    Physically abused: Not on file    Forced sexual activity: Not on file  Other Topics Concern  . Not on file  Social History Narrative  . Not on file    Review of Systems  Constitutional: Negative.   HENT: Negative.   Eyes: Negative.   Respiratory: Positive for shortness of breath.   Cardiovascular: Negative.  Negative for chest pain and leg swelling.  Gastrointestinal: Negative.   All other systems reviewed and are negative.   Vitals:   02/08/18 1634  BP: 110/84  Pulse: 85  SpO2: 94%     Physical Exam  Constitutional: He is oriented to person, place, and time. He appears well-developed and well-nourished.  HENT:  Head: Normocephalic and atraumatic.  Eyes: Conjunctivae are normal. Right eye exhibits no discharge. Left eye exhibits no  discharge.  Neck: Normal range of motion. Neck supple. No tracheal deviation present. No thyromegaly present.  Cardiovascular: Normal rate and regular rhythm.  Pulmonary/Chest: Effort normal and breath sounds normal. No respiratory distress. He has no wheezes. He has no rales. He exhibits no tenderness.  Abdominal: Soft. Bowel sounds are normal.  Musculoskeletal: He exhibits no edema.  Neurological: He is alert and oriented to person, place, and time.  Skin: Skin is warm and dry. He is not diaphoretic. No erythema.  Psychiatric: He has a normal mood and affect.   Data Reviewed: Previous sleep studies from 2014 showing mild obstructive sleep apnea Has been on CPAP therapy  Assessment:   Shortness of breath  Obesity-has been losing weight, exercises regularly  Likelihood of pulmonary embolism is very low-short of breath with activity, has no tachycardia at rest, not limited with physical activity  Obstructive sleep apnea-adequately treated with CPAP Very compliant with its use  Plan/Recommendations: Continue with CPAP for obstructive sleep apnea  Obtain an echocardiogram to assess cardiac function  Obtain a chest x-ray to assess for any anatomical changes  Pulmonary function study to assess for underlying full lung function at rest  Trial with Singulair  We will see you back in the office in a couple of months  Sherrilyn Rist MD [facility] 02/08/2018, 5:03 PM  CC: [name], MD

## 2018-02-08 NOTE — Patient Instructions (Signed)
Shortness of breath Obstructive sleep apnea-adequately treated with CPAP  Repeat home sleep study can be scheduled anytime-this will aid in obtaining a new device  We will get an echocardiogram Obtain a pulmonary function study Obtain a chest x-ray  We will see her back in about 2 months  I do not believe you have a pulmonary embolism with being able to stay as active as you are  Call with any significant concerns

## 2018-02-17 ENCOUNTER — Ambulatory Visit (HOSPITAL_COMMUNITY): Payer: 59 | Attending: Cardiology

## 2018-02-17 ENCOUNTER — Other Ambulatory Visit: Payer: Self-pay

## 2018-02-17 DIAGNOSIS — R0602 Shortness of breath: Secondary | ICD-10-CM | POA: Diagnosis present

## 2018-04-08 ENCOUNTER — Ambulatory Visit (INDEPENDENT_AMBULATORY_CARE_PROVIDER_SITE_OTHER): Payer: 59 | Admitting: Pulmonary Disease

## 2018-04-08 ENCOUNTER — Ambulatory Visit: Payer: 59 | Admitting: Pulmonary Disease

## 2018-04-08 DIAGNOSIS — R0602 Shortness of breath: Secondary | ICD-10-CM

## 2018-04-08 LAB — PULMONARY FUNCTION TEST
DL/VA % PRED: 125 %
DL/VA: 5.66 ml/min/mmHg/L
DLCO unc % pred: 126 %
DLCO unc: 39.88 ml/min/mmHg
FEF 25-75 Post: 4.93 L/sec
FEF 25-75 Pre: 4.46 L/sec
FEF2575-%Change-Post: 10 %
FEF2575-%Pred-Post: 127 %
FEF2575-%Pred-Pre: 115 %
FEV1-%Change-Post: 3 %
FEV1-%Pred-Post: 108 %
FEV1-%Pred-Pre: 104 %
FEV1-Post: 4.63 L
FEV1-Pre: 4.47 L
FEV1FVC-%Change-Post: 0 %
FEV1FVC-%PRED-PRE: 102 %
FEV6-%Change-Post: 2 %
FEV6-%Pred-Post: 107 %
FEV6-%Pred-Pre: 104 %
FEV6-Post: 5.68 L
FEV6-Pre: 5.52 L
FEV6FVC-%Change-Post: 0 %
FEV6FVC-%Pred-Post: 103 %
FEV6FVC-%Pred-Pre: 102 %
FVC-%Change-Post: 2 %
FVC-%PRED-PRE: 101 %
FVC-%Pred-Post: 104 %
FVC-PRE: 5.54 L
FVC-Post: 5.68 L
Post FEV1/FVC ratio: 81 %
Post FEV6/FVC ratio: 100 %
Pre FEV1/FVC ratio: 81 %
Pre FEV6/FVC Ratio: 100 %
RV % pred: 74 %
RV: 1.49 L
TLC % pred: 98 %
TLC: 7.12 L

## 2018-04-08 NOTE — Progress Notes (Signed)
PFT done today. 

## 2018-04-13 ENCOUNTER — Encounter: Payer: Self-pay | Admitting: Pulmonary Disease

## 2018-04-13 ENCOUNTER — Ambulatory Visit (INDEPENDENT_AMBULATORY_CARE_PROVIDER_SITE_OTHER): Payer: 59 | Admitting: Pulmonary Disease

## 2018-04-13 VITALS — BP 124/70 | HR 69 | Ht 72.75 in | Wt 301.2 lb

## 2018-04-13 DIAGNOSIS — Z9989 Dependence on other enabling machines and devices: Secondary | ICD-10-CM | POA: Diagnosis not present

## 2018-04-13 DIAGNOSIS — R0602 Shortness of breath: Secondary | ICD-10-CM | POA: Diagnosis not present

## 2018-04-13 DIAGNOSIS — G4733 Obstructive sleep apnea (adult) (pediatric): Secondary | ICD-10-CM | POA: Diagnosis not present

## 2018-04-13 NOTE — Patient Instructions (Signed)
Shortness of breath/awareness of your respiratory effort at rest  Breathing study as discussed-within normal limits Echocardiogram as discussed  Continue weight loss efforts Behavioral modifications  Continue CPAP for obstructive sleep apnea  I will see you back in the office in about 6 months Call with any significant concerns

## 2018-04-13 NOTE — Progress Notes (Signed)
Austin Mclean    [id number]    05-Oct-1970  Primary Care Physician:Ross, Dwyane Luo, MD  Referring Physician: [name], MD [place], [place] [address]  Chief complaint:   Shortness of breath-intermittently Concern about awareness of his breathing on some occasions  HPI:  Stable status since last visit No significant changes with respect to his breathing  Past history of pulmonary embolism for which is been on Xarelto Shortness of breath sometimes at rest Exercises regularly with no significant shortness of breath or chest pain or discomfort History of obstructive sleep apnea-compliant with CPAP use Denies any chest pains or chest discomfort  No history of heart disease  Sleeps well, functions well  Outpatient Encounter Medications as of 04/13/2018  Medication Sig  . buPROPion (ZYBAN) 150 MG 12 hr tablet Take 150 mg by mouth 2 (two) times daily.  . montelukast (SINGULAIR) 10 MG tablet Take 1 tablet (10 mg total) by mouth at bedtime.  . rivaroxaban (XARELTO) 10 MG TABS tablet Take 10 mg by mouth daily.   No facility-administered encounter medications on file as of 04/13/2018.     Allergies as of 04/13/2018  . (No Known Allergies)    Past Medical History:  Diagnosis Date  . Allergy   . Anxiety   . Colon polyps    pseudopolyps  . Crohn disease ([place])    inactive x 10 years  . Optic neuritis   . Pulmonary embolism ([place]) 2010   bilateral  . Sleep apnea     Past Surgical History:  Procedure Laterality Date  . COLONOSCOPY    . POLYPECTOMY    . unremarkable      Family History  Problem Relation Age of Onset  . Colon cancer Neg Hx   . Colon polyps Neg Hx   . Rectal cancer Neg Hx   . Stomach cancer Neg Hx   . Diabetes type I Cousin   . Cancer Other        grandparent?    Social History   Socioeconomic History  . Marital status: Married    Spouse name: Not on file  . Number of children: 2  . Years of education:  Not on file  . Highest education level: Not on file  Occupational History  . Occupation: Drug Stage manager: [employer]  . Financial resource strain: Not on file  . Food insecurity:    Worry: Not on file    Inability: Not on file  . Transportation needs:    Medical: Not on file    Non-medical: Not on file  Tobacco Use  . Smoking status: Former Smoker    Packs/day: 0.50    Years: 3.00    Pack years: 1.50    Types: Cigarettes    Last attempt to quit: 03/02/2000    Years since quitting: 18.1  . Smokeless tobacco: Never Used  . Tobacco comment: only in college years---social/occassional  Substance and Sexual Activity  . Alcohol use: Yes    Alcohol/week: 0.0 standard drinks    Comment: very rare  . Drug use: No  . Sexual activity: Not on file  Lifestyle  . Physical activity:    Days per week: Not on file    Minutes per session: Not on file  . Stress: Not on file  Relationships  . Social connections:    Talks on phone: Not on file    Gets  together: Not on file    Attends religious service: Not on file    Active member of club or organization: Not on file    Attends meetings of clubs or organizations: Not on file    Relationship status: Not on file  . Intimate partner violence:    Fear of current or ex partner: Not on file    Emotionally abused: Not on file    Physically abused: Not on file    Forced sexual activity: Not on file  Other Topics Concern  . Not on file  Social History Narrative  . Not on file    Review of Systems  Constitutional: Negative.   HENT: Negative.   Eyes: Negative.   Respiratory: Negative for shortness of breath.   Cardiovascular: Negative.  Negative for chest pain and leg swelling.  Gastrointestinal: Negative.   All other systems reviewed and are negative.   Vitals:   04/13/18 1027  BP: 124/70  Pulse: 69  SpO2: 94%     Physical Exam  Constitutional: He appears well-developed and well-nourished.    HENT:  Head: Normocephalic and atraumatic.  Eyes: Conjunctivae are normal. Right eye exhibits no discharge. Left eye exhibits no discharge.  Neck: Normal range of motion. Neck supple. No tracheal deviation present. No thyromegaly present.  Cardiovascular: Normal rate and regular rhythm.  Pulmonary/Chest: Effort normal and breath sounds normal. No respiratory distress. He has no wheezes. He has no rales. He exhibits no tenderness.  Abdominal: Soft. Bowel sounds are normal.  Skin: He is not diaphoretic.  Psychiatric: He has a normal mood and affect.   Data Reviewed: Previous sleep studies from 2014 showing mild obstructive sleep apnea Has been on CPAP therapy  PFT-no obstruction, no restriction, normal diffusing capacity Echocardiogram-diastolic dysfunction  Assessment:   Shortness of breath -Denies significant shortness of breath with activity -Awareness of his breathing patterns is what is concerning  Obesity-has been losing weight, exercises regularly -Continue weight loss efforts  Likelihood of pulmonary embolism is very low-short of breath with activity, has no tachycardia at rest, not limited with physical activity -Continues to be stable  Obstructive sleep apnea-adequately treated with CPAP Very compliant with its use  Plan/Recommendations: Continue with CPAP for obstructive sleep apnea  We will see you back in the office in 6 months  Continue with weight loss efforts Behavioral modifications regards to your diastolic dysfunction -Continue follow-up with cardiology  Sherrilyn Rist MD [facility] 04/13/2018, 10:39 AM  CC: [name], MD

## 2018-09-22 ENCOUNTER — Other Ambulatory Visit: Payer: Self-pay

## 2018-09-22 DIAGNOSIS — Z20822 Contact with and (suspected) exposure to covid-19: Secondary | ICD-10-CM

## 2018-09-24 LAB — NOVEL CORONAVIRUS, NAA: SARS-CoV-2, NAA: NOT DETECTED

## 2018-10-28 ENCOUNTER — Other Ambulatory Visit: Payer: Self-pay

## 2018-10-28 DIAGNOSIS — Z20822 Contact with and (suspected) exposure to covid-19: Secondary | ICD-10-CM

## 2018-10-29 LAB — NOVEL CORONAVIRUS, NAA: SARS-CoV-2, NAA: NOT DETECTED

## 2018-11-11 ENCOUNTER — Other Ambulatory Visit: Payer: Self-pay | Admitting: Family Medicine

## 2018-11-11 ENCOUNTER — Ambulatory Visit
Admission: RE | Admit: 2018-11-11 | Discharge: 2018-11-11 | Disposition: A | Discharge status: Home / Self Care | Payer: 59 | Source: Ambulatory Visit | Attending: Family Medicine | Admitting: Family Medicine

## 2018-11-11 DIAGNOSIS — R059 Cough, unspecified: Secondary | ICD-10-CM

## 2018-11-11 DIAGNOSIS — R05 Cough: Secondary | ICD-10-CM

## 2018-12-07 ENCOUNTER — Ambulatory Visit (INDEPENDENT_AMBULATORY_CARE_PROVIDER_SITE_OTHER): Payer: 59

## 2018-12-07 ENCOUNTER — Telehealth: Payer: Self-pay | Admitting: Pulmonary Disease

## 2018-12-07 ENCOUNTER — Other Ambulatory Visit: Payer: Self-pay | Admitting: Pulmonary Disease

## 2018-12-07 DIAGNOSIS — R0602 Shortness of breath: Secondary | ICD-10-CM

## 2018-12-07 DIAGNOSIS — R05 Cough: Secondary | ICD-10-CM

## 2018-12-07 DIAGNOSIS — R059 Cough, unspecified: Secondary | ICD-10-CM

## 2018-12-07 MED ORDER — LEVOFLOXACIN 500 MG PO TABS: 500.0000 mg | ORAL_TABLET | Freq: Every day | ORAL | 0 refills | Status: DC

## 2018-12-07 NOTE — Telephone Encounter (Signed)
LMTCB

## 2018-12-07 NOTE — Telephone Encounter (Signed)
Spoke with the pt and notified of recs per Dr Ander Slade  He verbalized understanding  Rx sent and the cxr order was placed

## 2018-12-07 NOTE — Telephone Encounter (Signed)
Lets get a CXR   levaquin 500mg  for 7 days  Sputum culture if not feeling better following the Levaquin  Will be glad to see him after

## 2018-12-07 NOTE — Telephone Encounter (Signed)
Pt returning missed call. 

## 2018-12-07 NOTE — Telephone Encounter (Signed)
Call returned to patient, he states he was recently tested for covid and it was negative (august 28). He is having increased cough and mucous production. He reports about a month ago he swallowed his food wrong and his cough has slowly gotten worse. He reports his mucous is dark green and brown. Denies SOB or chest pain. He confirms he is taking his singulair daily. He was given a 5 day z pack in august and he is not seeing any improvement noted in his cough. Offered a visit, the patient reports I don't see what will be done differently other than he will be asking the same questions you asked. I advised that is what the providers job is, to ask questions and evaluate. He reports he is willing to do the visit he just would like me to send message to AO first and see if he has any recommendations.   AO please advise. Thanks.

## 2018-12-14 ENCOUNTER — Telehealth: Payer: Self-pay | Admitting: Pulmonary Disease

## 2018-12-14 NOTE — Telephone Encounter (Signed)
LVMTCB x 1 for patient. 

## 2018-12-14 NOTE — Telephone Encounter (Signed)
CXR does not show any acute changes.  In Dr. Judson Roch note he specifies Sputum culture if not feeling better following the Levaquin Please schedule for appointment with Dr. Ander Slade , or another provider for further work up as he has not improved with telemedicine. Thanks   Normal heart size, mediastinal contours, and pulmonary vascularity.  Minimal peribronchial thickening.  No pulmonary infiltrate, pleural effusion, or pneumothorax.  Bones unremarkable.  IMPRESSION: Minimal bronchitic changes without infiltrate.

## 2018-12-14 NOTE — Telephone Encounter (Signed)
Spoke with patient. Patient had chest xray on 12/07/18 but pt never was given results.  Patient states he is not any better and is concerned of the results to his xray.   Dr. Ander Slade is out of office until Monday  SG will you please advise on patient's chest xray.

## 2018-12-15 NOTE — Telephone Encounter (Signed)
Called spoke with patient.  He states he is still having phelm all day  He said it has been a little better with the antibiotic.  I scheduled him an in office with AO for October 20 at 1030 per SG notes from AO  Nothing further needed at this time.

## 2018-12-15 NOTE — Telephone Encounter (Signed)
Pt returning call.  [phone]

## 2018-12-19 ENCOUNTER — Telehealth: Payer: Self-pay | Admitting: Pulmonary Disease

## 2018-12-19 NOTE — Telephone Encounter (Signed)
Spoke with patient. He wanted to know if he needed anything or needed to do anything in regards for the possible sputum culture for tomorrow. Advised him that he would receive everything he needed tomorrow during his appt. Patient verbalized understanding.  Nothing further needed at time of call.

## 2018-12-20 ENCOUNTER — Encounter: Payer: Self-pay | Admitting: Pulmonary Disease

## 2018-12-20 ENCOUNTER — Other Ambulatory Visit: Payer: Self-pay

## 2018-12-20 ENCOUNTER — Ambulatory Visit (INDEPENDENT_AMBULATORY_CARE_PROVIDER_SITE_OTHER): Payer: 59 | Admitting: Pulmonary Disease

## 2018-12-20 VITALS — BP 122/70 | HR 71 | Temp 97.2°F | Ht 72.0 in | Wt 259.4 lb

## 2018-12-20 DIAGNOSIS — Z9989 Dependence on other enabling machines and devices: Secondary | ICD-10-CM

## 2018-12-20 DIAGNOSIS — G4733 Obstructive sleep apnea (adult) (pediatric): Secondary | ICD-10-CM

## 2018-12-20 DIAGNOSIS — R0602 Shortness of breath: Secondary | ICD-10-CM

## 2018-12-20 NOTE — Patient Instructions (Signed)
Obstructive sleep apnea -Compliant with CPAP use  Nasal stuffiness, chest congestion -Nonresolution with use of courses of antibiotics  Flonase, Singulair, Zyrtec combination for least about a month  I will see back in the office in about 3 months  Congrats on your weight loss goals

## 2018-12-20 NOTE — Progress Notes (Signed)
Austin Mclean    [id number]    [birth year]  Primary Care Physician:Ross, Dwyane Luo, MD  Referring Physician: [name], MD [place],  [place] [address]  Chief complaint:   Shortness of breath-intermittently Still remains concerned about awareness of his breathing  HPI:  Stable status since last visit He does have nasal stuffiness and congestion Chest congestion and discomfort No significant changes with respect to his breathing Has recently used courses of antibiotics  Past history of pulmonary embolism for which is been on Xarelto Shortness of breath sometimes at rest Exercises regularly with no significant shortness of breath or chest pain or discomfort History of obstructive sleep apnea-compliant with CPAP use Denies any chest pains or chest discomfort  No history of heart disease  Sleeps well, functions well Has managed to lose over 40 pounds since her last visit Walks about 4 to 8 miles a day  Outpatient Encounter Medications as of 12/20/2018  Medication Sig  . buPROPion (ZYBAN) 150 MG 12 hr tablet Take 150 mg by mouth 2 (two) times daily.  . montelukast (SINGULAIR) 10 MG tablet Take 1 tablet (10 mg total) by mouth at bedtime.  . rivaroxaban (XARELTO) 10 MG TABS tablet Take 10 mg by mouth daily.  . [DISCONTINUED] levofloxacin (LEVAQUIN) 500 MG tablet Take 1 tablet (500 mg total) by mouth daily.  . [DISCONTINUED] levofloxacin (LEVAQUIN) 500 MG tablet Take 1 tablet (500 mg total) by mouth daily.   No facility-administered encounter medications on file as of 12/20/2018.     Allergies as of 12/20/2018  . (No Known Allergies)    Past Medical History:  Diagnosis Date  . Allergy   . Anxiety   . Colon polyps    pseudopolyps  . Crohn disease ([place])    inactive x 10 years  . Optic neuritis   . Pulmonary embolism ([place]) 2010   bilateral  . Sleep apnea     Past Surgical History:  Procedure Laterality Date  . COLONOSCOPY     . POLYPECTOMY    . unremarkable      Family History  Problem Relation Age of Onset  . Colon cancer Neg Hx   . Colon polyps Neg Hx   . Rectal cancer Neg Hx   . Stomach cancer Neg Hx   . Diabetes type I Cousin   . Cancer Other        grandparent?    Social History   Socioeconomic History  . Marital status: Married    Spouse name: Not on file  . Number of children: 2  . Years of education: Not on file  . Highest education level: Not on file  Occupational History  . Occupation: Drug Stage manager: [employer]  . Financial resource strain: Not on file  . Food insecurity    Worry: Not on file    Inability: Not on file  . Transportation needs    Medical: Not on file    Non-medical: Not on file  Tobacco Use  . Smoking status: Former Smoker    Packs/day: 0.50    Years: 3.00    Pack years: 1.50    Types: Cigarettes    Quit date: 03/02/2000    Years since quitting: 18.8  . Smokeless tobacco: Never Used  . Tobacco comment: only in college years---social/occassional  Substance and Sexual Activity  . Alcohol use: Yes    Alcohol/week: 0.0 standard  drinks    Comment: very rare  . Drug use: No  . Sexual activity: Not on file  Lifestyle  . Physical activity    Days per week: Not on file    Minutes per session: Not on file  . Stress: Not on file  Relationships  . Social Herbalist on phone: Not on file    Gets together: Not on file    Attends religious service: Not on file    Active member of club or organization: Not on file    Attends meetings of clubs or organizations: Not on file    Relationship status: Not on file  . Intimate partner violence    Fear of current or ex partner: Not on file    Emotionally abused: Not on file    Physically abused: Not on file    Forced sexual activity: Not on file  Other Topics Concern  . Not on file  Social History Narrative  . Not on file    Review of Systems  Constitutional:  Negative.   HENT:       Nasal stuffiness  Eyes: Negative.   Respiratory: Positive for shortness of breath.   Cardiovascular: Negative.  Negative for chest pain and leg swelling.  Gastrointestinal: Negative.   All other systems reviewed and are negative.   Vitals:   12/20/18 1029 12/20/18 1030  BP:  122/70  Pulse:  71  Temp: (!) 97.2 F (36.2 C)   SpO2:  99%     Physical Exam  Constitutional: He appears well-developed and well-nourished.  HENT:  Head: Normocephalic and atraumatic.  Eyes: Conjunctivae are normal. Right eye exhibits no discharge. Left eye exhibits no discharge.  Neck: Normal range of motion. Neck supple. No tracheal deviation present. No thyromegaly present.  Cardiovascular: Normal rate and regular rhythm.  Pulmonary/Chest: Effort normal and breath sounds normal. No respiratory distress. He has no wheezes. He has no rales. He exhibits no tenderness.  Abdominal: Soft. Bowel sounds are normal.  Skin: He is not diaphoretic.  Psychiatric: He has a normal mood and affect.   Data Reviewed: Previous sleep studies from 2014 showing mild obstructive sleep apnea Has been on CPAP therapy Compliance data not available today  PFT-no obstruction, no restriction, normal diffusing capacity Echocardiogram-diastolic dysfunction  Recent chest x-ray shows no acute infiltrate  Assessment:   Shortness of breath -Denies significant shortness of breath with activity -Awareness of his breathing patterns is what is concerning -He does have nasal stuffiness and congestion -Has recently used multiple courses of antibiotics  Obesity-has been losing weight, exercises regularly -Continue weight loss efforts -Has lost about 40 pounds in the last visit -Maintaining regular increased activity level  Obstructive sleep apnea-adequately treated with CPAP Very compliant with its use .  With significant weight loss-May consider repeating a sleep study to ascertain whether he still has  significant sleep disordered breathing .  He states he still has maybe about 40 pounds to lose  Plan/Recommendations: Continue with CPAP for obstructive sleep apnea  For nasal stuffiness and congestion-combine Flonase, Singulair, Zyrtec  Continue with weight loss efforts  Behavioral modifications regards to your diastolic dysfunction -Continue follow-up with cardiology  Encouraged to call with any significant concerns  Sherrilyn Rist MD [facility] 12/20/2018, 10:38 AM  CC: [name], MD

## 2019-10-21 IMAGING — DX DG CHEST 2V
2 series · 2 of 2 positions shown · non-contrast
Comparison: 11/11/2018

CLINICAL DATA: Cough, aspirated a small piece of chicken 6 weeks
ago, constant phlegm production and cough since, history pulmonary
embolism, Crohn's disease, former smoker

EXAM:
CHEST - 2 VIEW

[chest pa]
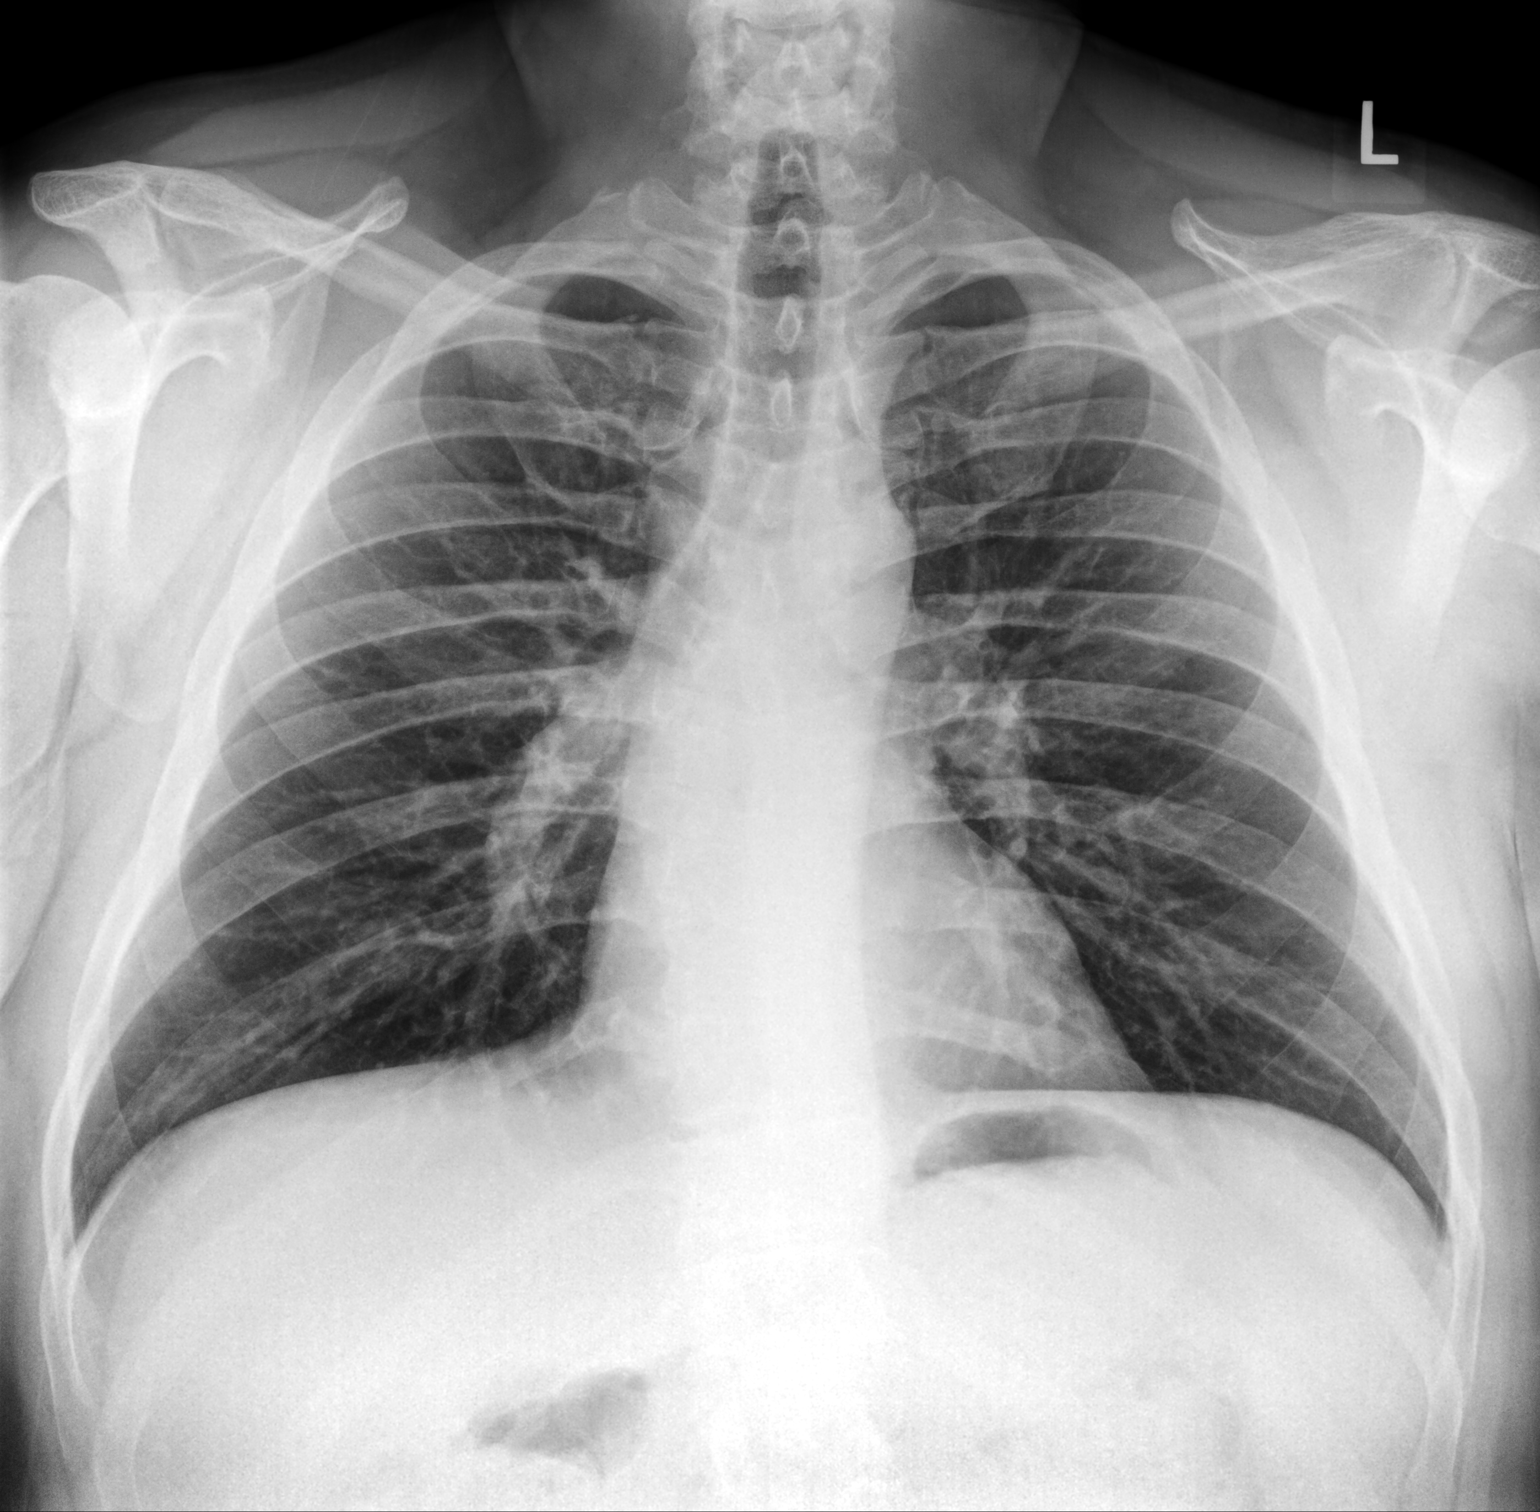

[chest lat]
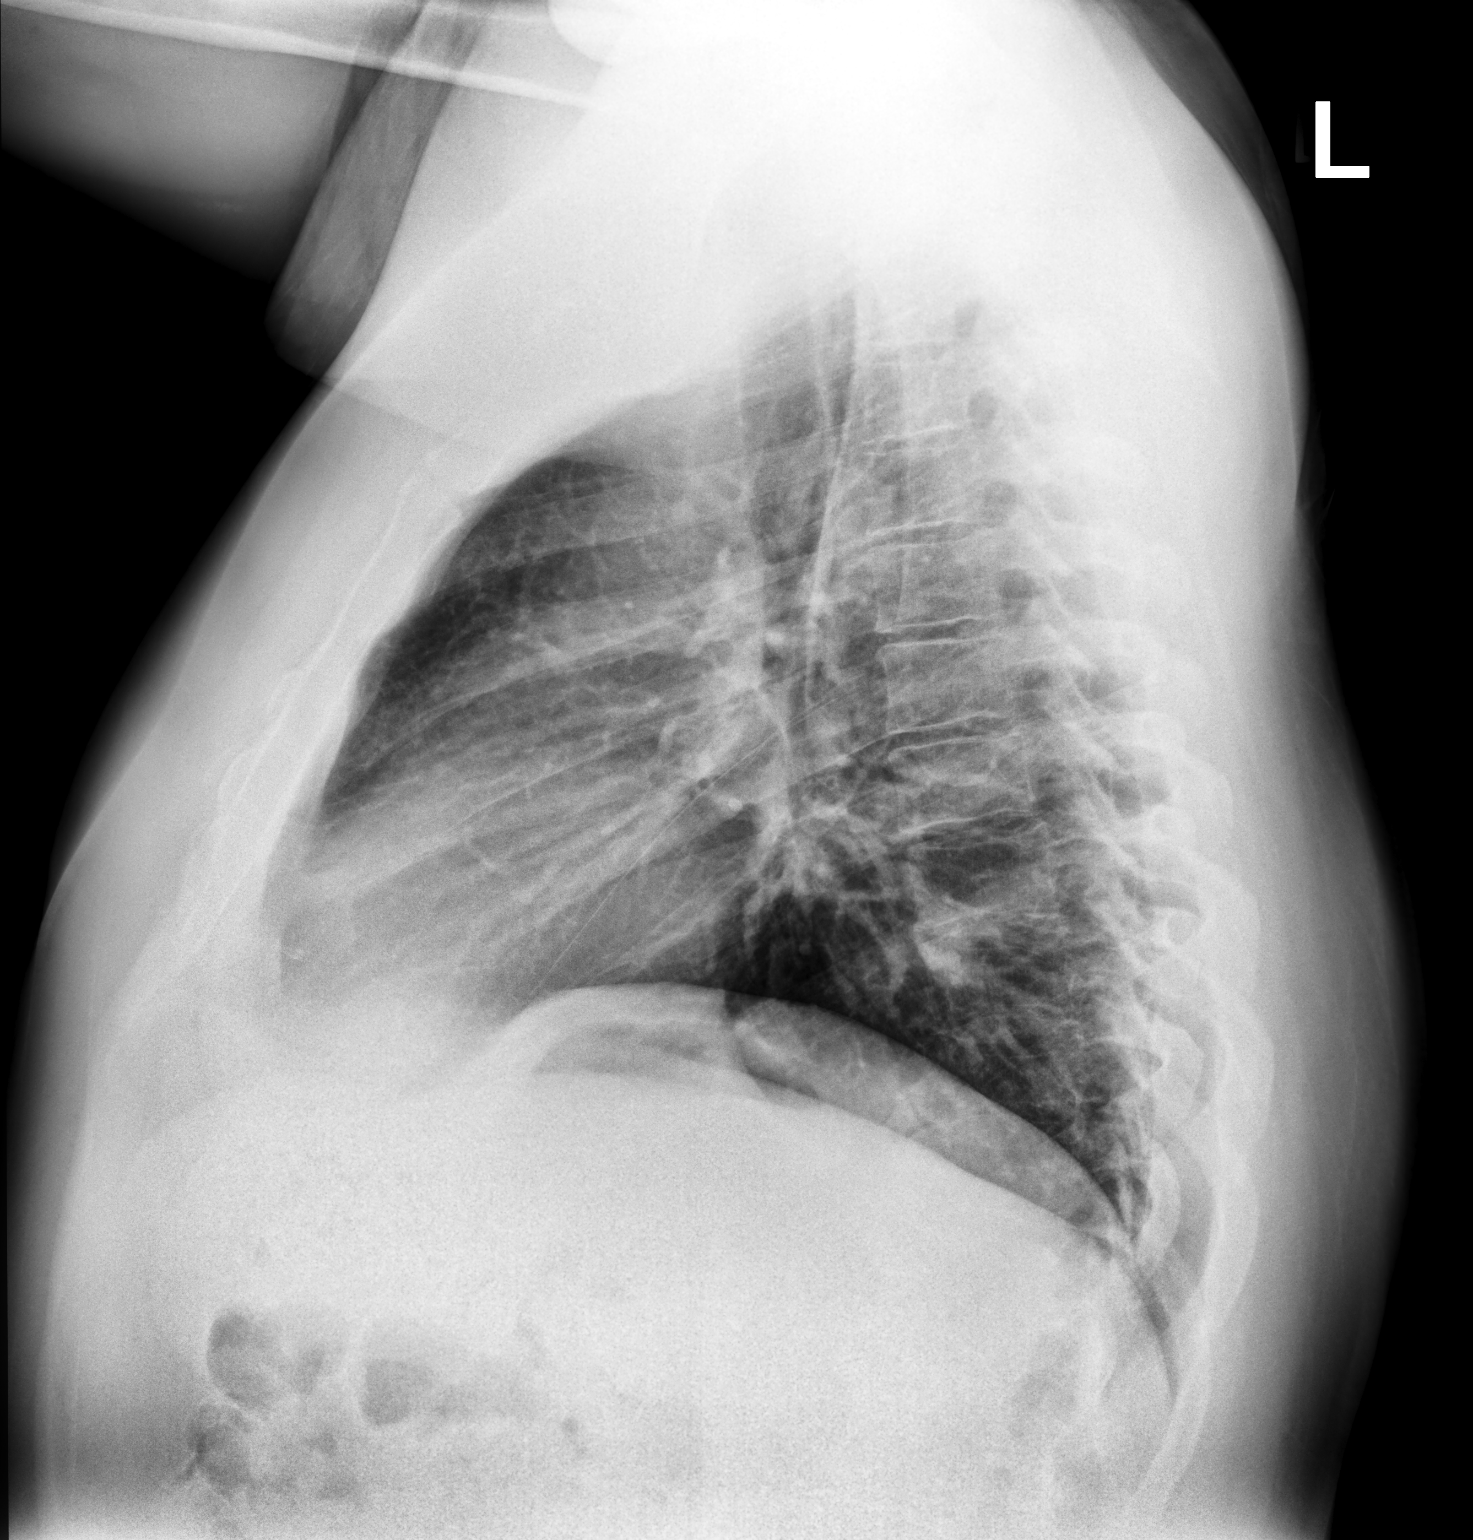

[2 of 2 positions shown; findings below may reference images not displayed]

FINDINGS: Normal heart size, mediastinal contours, and pulmonary vascularity.

Minimal peribronchial thickening.

No pulmonary infiltrate, pleural effusion, or pneumothorax.

Bones unremarkable.
IMPRESSION: Minimal bronchitic changes without infiltrate.

## 2020-03-18 ENCOUNTER — Ambulatory Visit: Payer: 59 | Admitting: Pulmonary Disease

## 2020-03-25 ENCOUNTER — Telehealth: Payer: Self-pay | Admitting: Neurology

## 2020-03-25 NOTE — Telephone Encounter (Signed)
I lvm for pt to call back to r/s his appt. Dr. Felecia Shelling is going to be out 2/23. He will be taking these patients on 2/4 or 2/18

## 2020-04-05 ENCOUNTER — Other Ambulatory Visit: Payer: Self-pay

## 2020-04-05 ENCOUNTER — Encounter: Payer: Self-pay | Admitting: Neurology

## 2020-04-05 ENCOUNTER — Ambulatory Visit (INDEPENDENT_AMBULATORY_CARE_PROVIDER_SITE_OTHER): Payer: No Typology Code available for payment source | Admitting: Neurology

## 2020-04-05 VITALS — BP 132/87 | HR 63 | Ht 73.0 in | Wt 236.0 lb

## 2020-04-05 DIAGNOSIS — H539 Unspecified visual disturbance: Secondary | ICD-10-CM | POA: Insufficient documentation

## 2020-04-05 DIAGNOSIS — Z8669 Personal history of other diseases of the nervous system and sense organs: Secondary | ICD-10-CM | POA: Diagnosis not present

## 2020-04-05 DIAGNOSIS — R200 Anesthesia of skin: Secondary | ICD-10-CM | POA: Insufficient documentation

## 2020-04-05 NOTE — Progress Notes (Signed)
[facility]  PATIENT: Austin Mclean DOB: 1970-07-11  REFERRING DOCTOR OR PCP: [name], MD SOURCE: Patient, notes from primary care  _________________________________   HISTORICAL  CHIEF COMPLAINT:  Chief Complaint  Patient presents with  . Follow-up    Rm 12 alone PT is having some MS concerns    HISTORY OF PRESENT ILLNESS:  I had the pleasure of seeing patient, Austin Mclean, at [facility] for a neurologic consultation regarding his history of optic neuritis and possible multiple sclerosis.  He is a 50 year old man who had visual symptoms 15 years ago and was told he might had had left optic neuritis.  He felt the vision improved a little bit but not to baseline and he continues to have reduced vision out of that eye.  He had an MRI of the brain that he reports was inconclusive.  He saw a neurologist once or twice but he did not follow up for a second MRI.   Unfortunately, we do not have any records from this time.  He has had no definite exacerbations but does get some neurologic symptoms at times.  Currently, he has no difficulty with gait or strength.    He has had some twitching and numbness in the right arm but none now.    He has some incomplete bladder emptying.    Vision is worse on the left.  However, color vision is symmetric. He has had some depression but is not experiencing any now.   He denies fatigue.  He usually sleeps well.  He does not note significant problems with cognition.  He is on Xarelto for a bilateral PE 7 years ago.  He noted a possible blood clot in his leg that occurred after a long flight to [facility].   He is fairly active.   He used to be overweight but now exercises.     No brain or spinal cord MRIs are available for review.   REVIEW OF SYSTEMS: Constitutional: No fevers, chills, sweats, or change in appetite Eyes: Persistent left visual changes.  No double vision, eye pain Ear, nose and  throat: No hearing loss, ear pain, nasal congestion, sore throat Cardiovascular: No chest pain, palpitations Respiratory: No shortness of breath at rest or with exertion.   No wheezes GastrointestinaI: No nausea, vomiting, diarrhea, abdominal pain, fecal incontinence Genitourinary: No dysuria, urinary retention or frequency.  No nocturia. Musculoskeletal: No neck pain, back pain Integumentary: No rash, pruritus, skin lesions Neurological: as above Psychiatric: No depression at this time.  No anxiety Endocrine: No palpitations, diaphoresis, change in appetite, change in weigh or increased thirst Hematologic/Lymphatic: No anemia, purpura, petechiae.  He is on Eliquis for pulmonary embolism Allergic/Immunologic: No itchy/runny eyes, nasal congestion, recent allergic reactions, rashes  ALLERGIES: No Known Allergies  HOME MEDICATIONS:  Current Outpatient Medications:  .  buPROPion (ZYBAN) 150 MG 12 hr tablet, Take 150 mg by mouth once., Disp: , Rfl:  .  rivaroxaban (XARELTO) 10 MG TABS tablet, Take 10 mg by mouth daily., Disp: , Rfl:   PAST MEDICAL HISTORY: Past Medical History:  Diagnosis Date  . Allergy   . Anxiety   . Colon polyps    pseudopolyps  . Crohn disease ([place])    inactive x 10 years  . Optic neuritis   . Pulmonary embolism ([place]) 2010   bilateral  . Sleep apnea     PAST SURGICAL HISTORY: Past Surgical History:  Procedure Laterality Date  . COLONOSCOPY    .  POLYPECTOMY    . unremarkable      FAMILY HISTORY: Family History  Problem Relation Age of Onset  . Colon cancer Neg Hx   . Colon polyps Neg Hx   . Rectal cancer Neg Hx   . Stomach cancer Neg Hx   . Diabetes type I Cousin   . Cancer Other        grandparent?    SOCIAL HISTORY:  Social History   Socioeconomic History  . Marital status: Married    Spouse name: Not on file  . Number of children: 2  . Years of education: Not on file  . Highest education level: Not on file  Occupational  History  . Occupation: Drug Stage manager: [employer]  Tobacco Use  . Smoking status: Former Smoker    Packs/day: 0.50    Years: 3.00    Pack years: 1.50    Types: Cigarettes    Quit date: 03/02/2000    Years since quitting: 20.1  . Smokeless tobacco: Never Used  . Tobacco comment: only in college years---social/occassional  Substance and Sexual Activity  . Alcohol use: Yes    Alcohol/week: 0.0 standard drinks    Comment: very rare  . Drug use: No  . Sexual activity: Not on file  Other Topics Concern  . Not on file  Social History Narrative  . Not on file   Social Determinants of Health   Financial Resource Strain: Not on file  Food Insecurity: Not on file  Transportation Needs: Not on file  Physical Activity: Not on file  Stress: Not on file  Social Connections: Not on file  Intimate Partner Violence: Not on file     PHYSICAL EXAM  Vitals:   04/05/20 1012  BP: 132/87  Pulse: 63  Weight: 236 lb (107 kg)  Height: 6' 1"  (1.854 m)    Body mass index is 31.14 kg/m.  Visual acuity was 20/20 OD and 20/50 OS  General: The patient is well-developed and well-nourished and in no acute distress  HEENT:  Head is [place]/AT.  Sclera are anicteric.  Funduscopic exam shows some optic nerve pallor on the left.  No edema retinal vessels were normal.  Neck: No carotid bruits are noted.  The neck is nontender.  Cardiovascular: The heart has a regular rate and rhythm with a normal S1 and S2. There were no murmurs, gallops or rubs.    Skin: Extremities are without rash or  edema.  Musculoskeletal:  Back is nontender  Neurologic Exam  Mental status: The patient is alert and oriented x 3 at the time of the examination. The patient has apparent normal recent and remote memory, with an apparently normal attention span and concentration ability.   Speech is normal.  Cranial nerves: Extraocular movements are full.  He has a 1+ left APD.  Visual fields are full.   Facial symmetry is present. There is good facial sensation to soft touch bilaterally.Facial strength is normal.  Trapezius and sternocleidomastoid strength is normal. No dysarthria is noted.  The tongue is midline, and the patient has symmetric elevation of the soft palate. No obvious hearing deficits are noted.  Motor:  Muscle bulk is normal.   Tone is normal. Strength is  5 / 5 in all 4 extremities.   Sensory: Sensory testing is intact to pinprick, soft touch and vibration sensation in all 4 extremities.  Coordination: Cerebellar testing reveals good finger-nose-finger and heel-to-shin bilaterally.  Gait and station: Station is normal.  Gait is normal. Tandem gait is normal. Romberg is negative.   Reflexes: Deep tendon reflexes are symmetric and normal bilaterally.   Plantar responses are flexor.    DIAGNOSTIC DATA (LABS, IMAGING, TESTING) - I reviewed patient records, labs, notes, testing and imaging myself where available.  Lab Results  Component Value Date   WBC 8.4 06/17/2010   HGB 16.3 06/17/2010   HCT 48.7 06/17/2010   MCV 83.5 06/17/2010   PLT 181 06/17/2010      Component Value Date/Time   NA 140 06/17/2010 1330   K 4.1 06/17/2010 1330   CL 105 06/17/2010 1330   CO2 28 06/17/2010 1330   GLUCOSE 103 (H) 06/17/2010 1330   BUN 8 06/17/2010 1330   CREATININE 1.20 06/17/2010 1330   CALCIUM 9.3 06/17/2010 1330   PROT 7.2 06/17/2010 1330   ALBUMIN 4.1 06/17/2010 1330   AST 28 06/17/2010 1330   ALT 44 06/17/2010 1330   ALKPHOS 40 06/17/2010 1330   BILITOT 0.8 06/17/2010 1330   GFRNONAA >60 06/17/2010 1330   GFRAA  06/17/2010 1330    >60        The eGFR has been calculated using the MDRD equation. This calculation has not been validated in all clinical situations. eGFR's persistently <60 mL/min signify possible Chronic Kidney Disease.       ASSESSMENT AND PLAN  History of optic neuritis - Plan: MR BRAIN W WO CONTRAST  Arm numbness  Vision  disturbance   In summary, Mr. Lawson Fiscal is a 50 year old man with a history of left optic neuritis about 15 to 17 years ago.  At the time, he was told that he could have multiple sclerosis.  Apparently, the MRI was indeterminate and follow-up MRIs were not done.  I discussed with him that optic neuritis in the 20s and 75s is often associated with a higher risk of multiple sclerosis.  Indeed, optic neuritis with a normal brain MRI at that time which still carry a risk of 25% and optic neuritis with an MRI showing several lesions consistent with MS (versus nonspecific) would have a risk as high as 85%.  Therefore, since he has also had some dysesthesias we need to check an MRI of the brain.  If there are no lesions or only nonspecific lesions, then the likelihood of MS is small and no further evaluation would be necessary.  However, we might need to also obtain additional information from CSF analysis depending on the results.    He will return to see me as needed based on the results of the study.  Of course he should also call us if new or worsening symptoms.  We will do additional testing if required and if the MRI is extremely consistent with MS consider a disease modifying therapy.     Toryn Dewalt A. Felecia Shelling, MD, [facility] 03/08/3843, 36:46 PM Certified in Neurology, Clinical Neurophysiology, Sleep Medicine and Neuroimaging  [facility] [address], [place],  [address] [phone]

## 2020-04-08 ENCOUNTER — Telehealth: Payer: Self-pay | Admitting: Neurology

## 2020-04-08 NOTE — Telephone Encounter (Signed)
[employer] order sent to [facility]. They will obtain the auth and reach out to the patient to schedule.

## 2020-04-09 ENCOUNTER — Ambulatory Visit: Payer: Self-pay | Admitting: Pulmonary Disease

## 2020-04-22 ENCOUNTER — Other Ambulatory Visit: Payer: Self-pay

## 2020-04-24 ENCOUNTER — Ambulatory Visit: Payer: No Typology Code available for payment source | Admitting: Neurology

## 2020-04-26 ENCOUNTER — Other Ambulatory Visit: Payer: Self-pay

## 2020-05-03 ENCOUNTER — Other Ambulatory Visit: Payer: Self-pay

## 2020-05-03 ENCOUNTER — Ambulatory Visit
Admission: RE | Admit: 2020-05-03 | Discharge: 2020-05-03 | Disposition: A | Discharge status: Home / Self Care | Payer: No Typology Code available for payment source | Source: Ambulatory Visit | Attending: Neurology | Admitting: Neurology

## 2020-05-03 DIAGNOSIS — Z8669 Personal history of other diseases of the nervous system and sense organs: Secondary | ICD-10-CM | POA: Diagnosis not present

## 2020-05-03 DIAGNOSIS — H539 Unspecified visual disturbance: Secondary | ICD-10-CM | POA: Diagnosis not present

## 2020-05-03 DIAGNOSIS — R200 Anesthesia of skin: Secondary | ICD-10-CM | POA: Diagnosis not present

## 2020-05-03 MED ORDER — GADOBENATE DIMEGLUMINE 529 MG/ML IV SOLN
20.0000 mL | Freq: Once | INTRAVENOUS | Status: AC | PRN
  Administered 2020-05-03: 20 mL via INTRAVENOUS

## 2020-05-07 ENCOUNTER — Other Ambulatory Visit: Payer: Self-pay

## 2020-05-07 ENCOUNTER — Encounter: Payer: Self-pay | Admitting: Pulmonary Disease

## 2020-05-07 ENCOUNTER — Telehealth: Payer: Self-pay | Admitting: Neurology

## 2020-05-07 ENCOUNTER — Ambulatory Visit (INDEPENDENT_AMBULATORY_CARE_PROVIDER_SITE_OTHER): Payer: No Typology Code available for payment source | Admitting: Pulmonary Disease

## 2020-05-07 VITALS — BP 122/82 | HR 57 | Temp 97.4°F | Ht 73.0 in | Wt 237.0 lb

## 2020-05-07 DIAGNOSIS — G4733 Obstructive sleep apnea (adult) (pediatric): Secondary | ICD-10-CM | POA: Diagnosis not present

## 2020-05-07 DIAGNOSIS — G379 Demyelinating disease of central nervous system, unspecified: Secondary | ICD-10-CM

## 2020-05-07 DIAGNOSIS — Z8669 Personal history of other diseases of the nervous system and sense organs: Secondary | ICD-10-CM

## 2020-05-07 MED ORDER — AMOXICILLIN-POT CLAVULANATE 875-125 MG PO TABS: 1.0000 | ORAL_TABLET | Freq: Two times a day (BID) | ORAL | 0 refills | Status: DC

## 2020-05-07 NOTE — Telephone Encounter (Signed)
I spoke with Austin Mclean about the MRI.  It shows multiple T2/FLAIR hyperintense foci in the pattern is consistent with MS.  He does not completely fulfill the McDonald criteria to diagnose MS.  We need to either show dissemination in time or he needs an abnormal cerebrospinal fluid.  An abnormal MRI of the spinal cord would also strengthen the likelihood that this is MS.  I will check an MRI of the cervical and thoracic spine.  Additionally we will try to see if the MRI from around 2006 can be found so it can be compared.

## 2020-05-07 NOTE — Telephone Encounter (Signed)
FYI: Pt called, inquiring about MRI results. Results have been sent to my MyChart, wanted to discuss results.  Informed Pt, once physician received results, nurse will contact you to discuss results. Pt verbally understood.

## 2020-05-07 NOTE — Progress Notes (Signed)
Austin Mclean    [id number]    [birth year]  Primary Care Physician:Ross, Dwyane Luo, MD  Referring Physician: [name], MD [place],  [place] [address]  Chief complaint:    Follow-up for obstructive sleep apnea  HPI:  Patient with a history of obstructive sleep apnea has lost over 70 pounds Still continues to lose weight  Machine is becoming inefficient  Has not been using it  Has some sore throat, redness, dysphagia Breathing is fine Has no cough Has the use a lozenge for sore throat in the morning and evening to get rested  Denies any nasal stuffiness or congestion  Continues to lose weight, exercises regularly  History of obstructive sleep apnea-compliant with CPAP use Denies any chest pains or chest discomfort  No history of heart disease  Sleeps well, functions well Has managed to lose over 70 pounds since her last visit He walks regularly  Outpatient Encounter Medications as of 05/07/2020  Medication Sig  . buPROPion (ZYBAN) 150 MG 12 hr tablet Take 150 mg by mouth once.  . rivaroxaban (XARELTO) 10 MG TABS tablet Take 10 mg by mouth daily.   No facility-administered encounter medications on file as of 05/07/2020.    Allergies as of 05/07/2020  . (No Known Allergies)    Past Medical History:  Diagnosis Date  . Allergy   . Anxiety   . Colon polyps    pseudopolyps  . Crohn disease ([place])    inactive x 10 years  . Optic neuritis   . Pulmonary embolism ([place]) 2010   bilateral  . Sleep apnea     Past Surgical History:  Procedure Laterality Date  . COLONOSCOPY    . POLYPECTOMY    . unremarkable      Family History  Problem Relation Age of Onset  . Colon cancer Neg Hx   . Colon polyps Neg Hx   . Rectal cancer Neg Hx   . Stomach cancer Neg Hx   . Diabetes type I Cousin   . Cancer Other        grandparent?    Social History   Socioeconomic History  . Marital status: Married    Spouse name: Not on  file  . Number of children: 2  . Years of education: Not on file  . Highest education level: Not on file  Occupational History  . Occupation: Drug Stage manager: [employer]  Tobacco Use  . Smoking status: Former Smoker    Packs/day: 0.50    Years: 3.00    Pack years: 1.50    Types: Cigarettes    Quit date: 03/02/2000    Years since quitting: 20.1  . Smokeless tobacco: Never Used  . Tobacco comment: only in college years---social/occassional  Substance and Sexual Activity  . Alcohol use: Yes    Alcohol/week: 0.0 standard drinks    Comment: very rare  . Drug use: No  . Sexual activity: Not on file  Other Topics Concern  . Not on file  Social History Narrative  . Not on file   Social Determinants of Health   Financial Resource Strain: Not on file  Food Insecurity: Not on file  Transportation Needs: Not on file  Physical Activity: Not on file  Stress: Not on file  Social Connections: Not on file  Intimate Partner Violence: Not on file    Review of Systems  Constitutional: Negative.   HENT: Positive  for sore throat.        Nasal stuffiness  Eyes: Negative.   Respiratory: Positive for apnea. Negative for shortness of breath.   Cardiovascular: Negative.  Negative for chest pain and leg swelling.  Gastrointestinal: Negative.   All other systems reviewed and are negative.   Vitals:   05/07/20 0937  BP: 122/82  Pulse: (!) 57  Temp: (!) 97.4 F (36.3 C)  SpO2: 98%     Physical Exam Constitutional:      Appearance: He is well-developed. He is not diaphoretic.  HENT:     Head: Normocephalic and atraumatic.     Mouth/Throat:     Mouth: Mucous membranes are moist.  Eyes:     General:        Right eye: No discharge.        Left eye: No discharge.     Conjunctiva/sclera: Conjunctivae normal.  Neck:     Thyroid: No thyromegaly.     Trachea: No tracheal deviation.  Cardiovascular:     Rate and Rhythm: Normal rate and regular rhythm.   Pulmonary:     Effort: Pulmonary effort is normal. No respiratory distress.     Breath sounds: Normal breath sounds. No wheezing, rhonchi or rales.  Chest:     Chest wall: No tenderness.  Abdominal:     Palpations: Abdomen is soft.  Musculoskeletal:     Cervical back: No rigidity or tenderness.  Lymphadenopathy:     Cervical: No cervical adenopathy.  Psychiatric:        Mood and Affect: Mood normal.    Data Reviewed: Previous sleep studies from 2014 showing mild obstructive sleep apnea Has been on CPAP therapy Compliance data not available today  PFT 04/08/2018-no obstruction, no restriction, normal diffusing capacity Echocardiogram 02/17/2018-diastolic dysfunction  Recent chest x-ray shows no acute infiltrate  Assessment:   Obstructive sleep apnea -Has lost over 70 pounds since last visit -Continues to lose weight -Has not been using his CPAP on a regular basis and wonders whether he still needs his CPAP -No witnesses to let them know whether he has snoring or apneic episodes  Sore throat -I will give him a course of Augmentin  Plan/Recommendations:  Obtain a home sleep study to ascertain whether he still has significant obstructive sleep apnea  Augmentin for sore throat  Encourage continuing weight loss efforts  I will see him back in about 6 months  I will apprise him of sleep study results once reviewed  Sherrilyn Rist MD [facility] 05/07/2020, 9:43 AM  CC: [name], MD

## 2020-05-07 NOTE — Patient Instructions (Signed)
Schedule him for home sleep study to ascertain whether you still have significant sleep apnea -No treatment indicated if you have no significant symptoms during the day -An oral device can be an option of treatment if very mild and you have symptoms  I will call you in a course of Augmentin for the sore throat  Follow-up in 6 months -I will update you with results of the sleep study once reviewed

## 2020-05-08 ENCOUNTER — Telehealth: Payer: Self-pay | Admitting: Neurology

## 2020-05-08 NOTE — Telephone Encounter (Signed)
Pt has called to inform Dr Felecia Shelling that it was about 15 years ago that he had the last MRI done and he believes it was with Dr Leta Baptist.  Pt would like to discuss this further once Dr Felecia Shelling has been able to compare the MRI from years ago with Dr Leta Baptist to his most recent MRI

## 2020-05-08 NOTE — Telephone Encounter (Signed)
[employer] order sent to [facility]. They will reach out to the patient to scheduled and obtain the auth.  

## 2020-05-08 NOTE — Telephone Encounter (Signed)
Spoke with Hilda Blades. She said she placed MRI CD's/report on your desk this am to review

## 2020-05-09 ENCOUNTER — Telehealth: Payer: Self-pay | Admitting: Neurology

## 2020-05-09 NOTE — Telephone Encounter (Signed)
I reviewed the MRI of the brain from 11/14/2010 and the MRI of the cervical and thoracic spine from 12/25/2010.  The MRI of the brain showed multiple T2/FLAIR hyperintense foci including lesions in the periventricular and juxtacortical white matter.  There were 2 foci in the cerebellar hemispheres and 1 in the pons.  I compare that MRI with his recent MRI of the brain performed 05/03/2020.  During the interim, 3-4 additional foci have developed.  These are located in the periventricular or deep white matter.  The MRI of the spine showed subtle T2 hyperintense foci on the sagittal images adjacent to C3-C4 and C4-C5 that were not clearly seen on sagittal STIR or axial views.  Additionally, there or degenerative changes with mild spinal stenosis at C5-C6.

## 2020-05-16 ENCOUNTER — Ambulatory Visit
Admission: RE | Admit: 2020-05-16 | Discharge: 2020-05-16 | Disposition: A | Discharge status: Home / Self Care | Payer: No Typology Code available for payment source | Source: Ambulatory Visit | Attending: Neurology | Admitting: Neurology

## 2020-05-16 ENCOUNTER — Other Ambulatory Visit: Payer: Self-pay

## 2020-05-16 DIAGNOSIS — G379 Demyelinating disease of central nervous system, unspecified: Secondary | ICD-10-CM

## 2020-05-16 DIAGNOSIS — Z8669 Personal history of other diseases of the nervous system and sense organs: Secondary | ICD-10-CM | POA: Diagnosis not present

## 2020-05-16 NOTE — Telephone Encounter (Signed)
I spoke with Austin Mclean over the phone about the results of the spine MRIs.  I have previously spoken to him about the results of the brain MRI.  I was able to compare these with previous studies.  The brain MRI shows four new lesions on the current study that were not present on the previous study.  The MRI of the cervical spine did not show any MS plaque.  The MRI of the thoracic spine showed 3 plaques consistent with MS, none present on the 2012 study. The combination of optic neuritis, characteristics of the MRI lesions in the supratentorial, infratentorial white matter and spinal cord as well as the change over time is consistent with clinically definite multiple sclerosis.  Therefore, I recommend that we initiate treatment.  We will try to get him into the office in the next week or 2 to go over the different treatment options and to obtain lab work to enable [place] to initiate therapy.

## 2020-05-20 NOTE — Telephone Encounter (Signed)
Called pt. Scheduled work in appt for 05/23/20 at [facility] w/ Dr. Felecia Shelling. Asked he check in by 830am. He verbalized understanding.

## 2020-05-21 NOTE — Telephone Encounter (Signed)
Took call from phone staff and spoke with pt. He requested to schedule appt for this Wed instead of his appt on Thursday. His schedule opened up for tomorrow. Scheduled appt for tomorrow at 1:30pm with Dr. Felecia Shelling. Asked her check in by 1:00. I cx appt for Thursday.

## 2020-05-22 ENCOUNTER — Encounter: Payer: Self-pay | Admitting: Neurology

## 2020-05-22 ENCOUNTER — Telehealth: Payer: Self-pay | Admitting: *Deleted

## 2020-05-22 ENCOUNTER — Ambulatory Visit: Payer: No Typology Code available for payment source

## 2020-05-22 ENCOUNTER — Other Ambulatory Visit: Payer: Self-pay

## 2020-05-22 ENCOUNTER — Ambulatory Visit (INDEPENDENT_AMBULATORY_CARE_PROVIDER_SITE_OTHER): Payer: No Typology Code available for payment source | Admitting: Neurology

## 2020-05-22 VITALS — BP 105/65 | HR 59 | Ht 73.0 in | Wt 240.0 lb

## 2020-05-22 DIAGNOSIS — E559 Vitamin D deficiency, unspecified: Secondary | ICD-10-CM

## 2020-05-22 DIAGNOSIS — G4733 Obstructive sleep apnea (adult) (pediatric): Secondary | ICD-10-CM | POA: Diagnosis not present

## 2020-05-22 DIAGNOSIS — G35 Multiple sclerosis: Secondary | ICD-10-CM

## 2020-05-22 DIAGNOSIS — Z79899 Other long term (current) drug therapy: Secondary | ICD-10-CM | POA: Diagnosis not present

## 2020-05-22 NOTE — Progress Notes (Signed)
[facility]  PATIENT: Austin Mclean DOB: 04-01-1970  REFERRING DOCTOR OR PCP: [name], MD SOURCE: Patient, notes from primary care  _________________________________   HISTORICAL  CHIEF COMPLAINT:  Chief Complaint  Patient presents with  . Follow-up    RM 13. Here to discuss DMT options for treatment of MS.    HISTORY OF PRESENT ILLNESS:   Austin Mclean is a 50 year old man with MS diagnosed in 04/2020 but  Update 05/22/2020: The brain MRI shows four new lesions on the current study that were not present on the previous study.  The MRI of the cervical spine did not show any MS plaque.  The MRI of the thoracic spine showed 3 plaques consistent with MS, none present on the 2012 study.    The combination of optic neuritis, characteristics of the MRI lesions in the supratentorial, infratentorial white matter and spinal cord as well as the change over time is consistent with clinically definite multiple sclerosis.  We had a long discussion about disease modifying therapies for MS.  He also has Crohn's disease but has done well without immunomodulatory treatment.  In the most detail we discussed the S1 P receptor modulators as a would likely help both MS and inflammatory bowel disease.  Tysabri is FDA approved for MS and was also approved for Crohn's disease.  He notes some difficulty with ADD  MS history: He had left  optic neuritis in 2012.  An MRI of the brain was abnormal but MRI of the spinal cord was normal.  He was told that he might have MS.  He felt the vision improved a little bit but not to baseline and he continues to have reduced vision out of that eye.  He never followed up for additional MRIs.  Since then, he denies any definite exacerbations but has had some neurologic symptoms at times.  Specifically, he has had some twitching and numbness in the right arm but none now.    He has incomplete bladder emptying.    Vision remains worse on the left.   However, color vision is symmetric.   He is on Xarelto for a bilateral PE 7 years ago.  He noted a possible blood clot in his leg that occurred after a long flight to [facility].   He is fairly active.   He used to be overweight but now exercises.     Imaging: MRI of the brain 05/04/2020 showed multiple T2/FLAIR hyperintense foci in the hemispheres, right thalamus, and one focus each in the cerebellum and pons.  4 of the foci in the hemispheres were not present in 2012.  MRI of the cervical spine 05/16/2020 shows a normal spinal cord and multilevel degenerative changes with mild spinal stenosis at C4-C5 through C6-C7.  The degenerative changes have progressed some compared to the 12/26/2010 MRI.  MRI of the thoracic spine 05/16/2020 showed foci within the spinal cord adjacent to T4-T5 anteriorly, centrally adjacent to T6-T7 and posterolaterally to the left and centrally adjacent to T10.  None of these foci were present on the MRI of the thoracic spine from 12/26/2010.  REVIEW OF SYSTEMS: Constitutional: No fevers, chills, sweats, or change in appetite Eyes: Persistent left visual changes.  No double vision, eye pain Ear, nose and throat: No hearing loss, ear pain, nasal congestion, sore throat Cardiovascular: No chest pain, palpitations Respiratory: No shortness of breath at rest or with exertion.   No wheezes GastrointestinaI: No nausea, vomiting, diarrhea, abdominal pain,  fecal incontinence Genitourinary: No dysuria, urinary retention or frequency.  No nocturia. Musculoskeletal: No neck pain, back pain Integumentary: No rash, pruritus, skin lesions Neurological: as above Psychiatric: No depression at this time.  No anxiety Endocrine: No palpitations, diaphoresis, change in appetite, change in weigh or increased thirst Hematologic/Lymphatic: No anemia, purpura, petechiae.  He is on Eliquis for pulmonary embolism Allergic/Immunologic: No itchy/runny eyes, nasal congestion, recent allergic  reactions, rashes  ALLERGIES: No Known Allergies  HOME MEDICATIONS:  Current Outpatient Medications:  .  buPROPion (ZYBAN) 150 MG 12 hr tablet, Take 150 mg by mouth once., Disp: , Rfl:  .  rivaroxaban (XARELTO) 10 MG TABS tablet, Take 10 mg by mouth daily., Disp: , Rfl:   PAST MEDICAL HISTORY: Past Medical History:  Diagnosis Date  . Allergy   . Anxiety   . Colon polyps    pseudopolyps  . Crohn disease ([place])    inactive x 10 years  . Optic neuritis   . Pulmonary embolism ([place]) 2010   bilateral  . Sleep apnea     PAST SURGICAL HISTORY: Past Surgical History:  Procedure Laterality Date  . COLONOSCOPY    . POLYPECTOMY    . unremarkable      FAMILY HISTORY: Family History  Problem Relation Age of Onset  . Colon cancer Neg Hx   . Colon polyps Neg Hx   . Rectal cancer Neg Hx   . Stomach cancer Neg Hx   . Diabetes type I Cousin   . Cancer Other        grandparent?    SOCIAL HISTORY:  Social History   Socioeconomic History  . Marital status: Married    Spouse name: Not on file  . Number of children: 2  . Years of education: Not on file  . Highest education level: Not on file  Occupational History  . Occupation: Drug Stage manager: [employer]  Tobacco Use  . Smoking status: Former Smoker    Packs/day: 0.50    Years: 3.00    Pack years: 1.50    Types: Cigarettes    Quit date: 03/02/2000    Years since quitting: 20.2  . Smokeless tobacco: Never Used  . Tobacco comment: only in college years---social/occassional  Substance and Sexual Activity  . Alcohol use: Yes    Alcohol/week: 0.0 standard drinks    Comment: very rare  . Drug use: No  . Sexual activity: Not on file  Other Topics Concern  . Not on file  Social History Narrative  . Not on file   Social Determinants of Health   Financial Resource Strain: Not on file  Food Insecurity: Not on file  Transportation Needs: Not on file  Physical Activity: Not on file  Stress: Not on  file  Social Connections: Not on file  Intimate Partner Violence: Not on file     PHYSICAL EXAM  Vitals:   05/22/20 1311  BP: 105/65  Pulse: (!) 59  SpO2: 98%  Weight: 240 lb (108.9 kg)  Height: 6' 1" (1.854 m)    Body mass index is 31.66 kg/m.  Visual acuity was 20/20 OD and 20/50 OS  General: The patient is well-developed and well-nourished and in no acute distress  HEENT:  Head is [place]/AT.  Sclera are anicteric.   Skin: Extremities are without rash or  edema.  Neurologic Exam  Mental status: The patient is alert and oriented x 3 at the time of the examination. The patient has apparent normal  recent and remote memory, with an apparently normal attention span and concentration ability.   Speech is normal.  Cranial nerves: Extraocular movements are full.  He has a 1+ left APD.  Visual fields are full.  Facial symmetry is present. There is good facial sensation to soft touch bilaterally.Facial strength is normal.  Trapezius and sternocleidomastoid strength is normal. No dysarthria is noted.  The tongue is midline, and the patient has symmetric elevation of the soft palate. No obvious hearing deficits are noted.  Motor:  Muscle bulk is normal.   Tone is normal. Strength is  5 / 5 in all 4 extremities.   Sensory: Sensory testing is intact to pinprick, soft touch and vibration sensation in all 4 extremities.  Coordination: Cerebellar testing reveals good finger-nose-finger and heel-to-shin bilaterally.  Gait and station: Station is normal.   Gait is normal. Tandem gait is normal. Romberg is negative.   Reflexes: Deep tendon reflexes are symmetric and normal bilaterally.        DIAGNOSTIC DATA (LABS, IMAGING, TESTING) - I reviewed patient records, labs, notes, testing and imaging myself where available.  Lab Results  Component Value Date   WBC 8.4 06/17/2010   HGB 16.3 06/17/2010   HCT 48.7 06/17/2010   MCV 83.5 06/17/2010   PLT 181 06/17/2010      Component Value  Date/Time   NA 140 06/17/2010 1330   K 4.1 06/17/2010 1330   CL 105 06/17/2010 1330   CO2 28 06/17/2010 1330   GLUCOSE 103 (H) 06/17/2010 1330   BUN 8 06/17/2010 1330   CREATININE 1.20 06/17/2010 1330   CALCIUM 9.3 06/17/2010 1330   PROT 7.2 06/17/2010 1330   ALBUMIN 4.1 06/17/2010 1330   AST 28 06/17/2010 1330   ALT 44 06/17/2010 1330   ALKPHOS 40 06/17/2010 1330   BILITOT 0.8 06/17/2010 1330   GFRNONAA >60 06/17/2010 1330   GFRAA  06/17/2010 1330    >60        The eGFR has been calculated using the MDRD equation. This calculation has not been validated in all clinical situations. eGFR's persistently <60 mL/min signify possible Chronic Kidney Disease.       ASSESSMENT AND PLAN  Multiple sclerosis ([place]) - Plan: QuantiFERON-TB Gold Plus, Varicella zoster antibody, IgG, Stratify JCV Antibody Test ([facility]), Other/Misc lab test, Other/Misc lab test  High risk medication use - Plan: QuantiFERON-TB Gold Plus, Varicella zoster antibody, IgG, Stratify JCV Antibody Test ([facility]), Other/Misc lab test, Other/Misc lab test  Vitamin D deficiency - Plan: VITAMIN D 25 Hydroxy (Vit-D Deficiency, Fractures)  1.  Mr. Grover meets McDonald criteria for relapsing remitting MS.  He had optic neuritis in 2012.  He has multiple T2/FLAIR hyperintense foci in the supratentorial and infratentorial white matter.  4 foci in the cerebral hemispheres and 3 foci of the thoracic spine seen on the current MRIs were not present in 2012. 2.    We discussed treatment options.  Although he does have progression in the last 10 years on MRI, he has not had any exacerbations.  Therefore, he does not have an aggressive MS.  There are multiple treatment options we could consider.  I briefly gave an overview.  He would prefer not to do so self injectable medication as there are other options.  In the most detail we discussed the S1 P receptor modulators as there is good evidence that they might also have efficacy for  Crohn's disease.  Tysabri is also FDA approved for both MS and  Crohn's.  We will check some lab work today. 3.   Stay active.  We discussed good diet and taking vitamin D 4.   Return in 3 months or sooner if there are new or worsening neurologic symptoms.   45-minute office visit with the majority of the time spent face-to-face for history and physical, discussion/counseling and decision-making.  Additional time with record review and documentation.   Richard A. Felecia Shelling, MD, [facility] 4/56/2563, 8:93 PM Certified in Neurology, Clinical Neurophysiology, Sleep Medicine and Neuroimaging  [facility] [address], [place], [place] [address] [phone]

## 2020-05-22 NOTE — Telephone Encounter (Signed)
Placed JCV lab in [facility] lock box for routine lab pick up. Results pending. 

## 2020-05-23 ENCOUNTER — Ambulatory Visit: Payer: Self-pay | Admitting: Neurology

## 2020-05-29 ENCOUNTER — Telehealth: Payer: Self-pay | Admitting: Neurology

## 2020-05-29 LAB — QUANTIFERON-TB GOLD PLUS
QuantiFERON Mitogen Value: >10 IU/mL
QuantiFERON Nil Value: 0.02 IU/mL
QuantiFERON TB1 Ag Value: 0.04 IU/mL
QuantiFERON TB2 Ag Value: 0.02 IU/mL
QuantiFERON-TB Gold Plus: NEGATIVE

## 2020-05-29 LAB — CYP2C9 GENOTYPING SIPONIMOD

## 2020-05-29 LAB — VITAMIN D 25 HYDROXY (VIT D DEFICIENCY, FRACTURES): Vit D, 25-Hydroxy: 37 ng/mL (ref 30.0–100.0)

## 2020-05-29 LAB — VARICELLA ZOSTER ANTIBODY, IGG: Varicella zoster IgG: 1059 index (ref >165)

## 2020-05-29 NOTE — Telephone Encounter (Signed)
JCV ab drawn on 05/22/20 positive, index: 0.93. Gave to MD to review

## 2020-05-29 NOTE — Telephone Encounter (Signed)
Pt called, regarding my results and recommended medication and when can I pick up medication. Would like a call from the nurse.

## 2020-05-29 NOTE — Telephone Encounter (Signed)
Dr. Felecia Shelling- this is the patient you said you were going to call and discuss treatment options with

## 2020-05-30 ENCOUNTER — Telehealth: Payer: Self-pay | Admitting: Pulmonary Disease

## 2020-05-30 DIAGNOSIS — G4733 Obstructive sleep apnea (adult) (pediatric): Secondary | ICD-10-CM | POA: Diagnosis not present

## 2020-05-30 NOTE — Telephone Encounter (Signed)
Called spoke with patient.  Let him know Dr. Judson Roch recommendations.  Patient set up follow up for Jul 04 2020 at 3:45 with Dr. Ander Slade.   Nothing further needed at this time.

## 2020-05-30 NOTE — Telephone Encounter (Signed)
Call patient  Sleep study result  Date of study: 05/22/2020  Impression: Mild obstructive sleep apnea No significant oxygen desaturations  Recommendation:  Schedule follow-up for discussion regarding options of care, expectation is that sleep apnea will continue to improve with consistent weight loss  Options of treatment of mild obstructive sleep apnea will include 1.  Watchful waiting with focus on continuing weight loss  2.  Continue CPAP therapy  3.  An oral device may be considered as an option of treatment for mild sleep disordered breathing

## 2020-05-31 NOTE — Telephone Encounter (Signed)
I spoke with Austin Mclean.  His labs are fine to go 1 year there Zeposia or Mayzent.  He signed a form when he was here last week.  He has not had an EKG so I believe Austin Mclean has a checkbox for the patient to have an EKG performed in the home.  If we are unable to get an EKG in the next few days without mechanism, we can have him come into the office to have an EKG done.

## 2020-06-03 NOTE — Telephone Encounter (Signed)
I called patient.  He would prefer to start [place].  I advised him that he will need an EKG, macular edema screening, CBC, and LFTs.  He has Pharmacist, community and he should be able to do this at home through the [place] [facility] support program.  He is agreeable to co-pay assistance if needed.  I advised him that we will call him when his results come in and he is cleared for therapy.  I will start working on prior authorization.  Patient verbalized understanding.  I spoke with Dr. Felecia Shelling.  He has signed the [place] start form.  He agreed that patient will need CBC, LFTs, ME screening, EKG prior to starting Zeposia.  Zeposia start form to [facility] support.  Received a receipt of confirmation.

## 2020-06-03 NOTE — Telephone Encounter (Addendum)
I attempted PA for Zeposia on CMM.  Member cannot be found.  I called [facility], spoke with Blanch Media She reports that I need to call [facility] specialty pharmacy to complete PA.  I advised Blanch Media thatthis seems incorrect.  I should complete authorization through her insurance company.  She insisted I call [facility] at [phone] to obtain a CPT code for the medication Zeposia.  Then I can try a PA.  I attempted to call [facility] at the phone number provided but it is an invalid number.  I will attempt a PA once patient is cleared for therapy.

## 2020-06-10 NOTE — Telephone Encounter (Signed)
I called [facility]. Spoke with Heidi.  Patient is scheduled for his assessment on June 17, 2020 at 5 PM.  I will check next week for these results.  Unfortunately, they cannot submit the prescription to the specialty pharmacy until patient is cleared for therapy.  I will wait until this is completed before starting a prior authorization.

## 2020-06-11 NOTE — Telephone Encounter (Signed)
Completed PA for Zeposia via [place].  Sent to [employer].  Should have a determination within 1-3 business days.Key: [id number].

## 2020-06-17 NOTE — Telephone Encounter (Signed)
Received notice from [facility] that patient's Zeposia was denied by [employer].  I called [employer].  Austin Mclean was denied because patient has not tried their preferred formulary alternatives of glatiramer or dimethyl fumarate.  The case ID is [id number].  The fax to be appeal department is [phone].

## 2020-06-17 NOTE — Telephone Encounter (Signed)
Appeal letter written for Zeposia.  Faxed to via epic to clinical appeals department at [employer].  Marked as urgent.

## 2020-06-18 NOTE — Telephone Encounter (Signed)
I called [facility] help.  Patient rescheduled his assessments until June 21, 2020.  I will check with them next week to receive the results of these assessments.

## 2020-06-18 NOTE — Telephone Encounter (Signed)
I received a denial for the appeal on Zeposia.  I called [employer].  They confirmed that the PA and appeal for Zeposia were both denied.  I called [facility] help.  They asked that the PA and appeal denial faxed to them.  The patient may be able to be enrolled in the bridge program.  This will depend on the results of his assessments as well.

## 2020-06-21 ENCOUNTER — Other Ambulatory Visit: Payer: Self-pay | Admitting: Neurology

## 2020-06-22 LAB — HEPATIC FUNCTION PANEL
ALT: 13 IU/L (ref 0–44)
AST: 12 IU/L (ref 0–40)
Albumin: 4.5 g/dL (ref 4.0–5.0)
Alkaline Phosphatase: 45 IU/L (ref 44–121)
Bilirubin Total: 0.6 mg/dL (ref 0.0–1.2)
Bilirubin, Direct: 0.17 mg/dL (ref 0.00–0.40)
Total Protein: 6.8 g/dL (ref 6.0–8.5)

## 2020-06-22 LAB — CBC WITH DIFFERENTIAL/PLATELET
Basophils Absolute: 0.1 10*3/uL (ref 0.0–0.2)
Basos: 1 %
EOS (ABSOLUTE): 0 10*3/uL (ref 0.0–0.4)
Eos: 1 %
Hematocrit: 48.2 % (ref 37.5–51.0)
Hemoglobin: 16.1 g/dL (ref 13.0–17.7)
Immature Grans (Abs): 0 10*3/uL (ref 0.0–0.1)
Immature Granulocytes: 0 %
Lymphocytes Absolute: 1.2 10*3/uL (ref 0.7–3.1)
Lymphs: 27 %
MCH: 28.6 pg (ref 26.6–33.0)
MCHC: 33.4 g/dL (ref 31.5–35.7)
MCV: 86 fL (ref 79–97)
Monocytes Absolute: 0.4 10*3/uL (ref 0.1–0.9)
Monocytes: 9 %
Neutrophils Absolute: 2.8 10*3/uL (ref 1.4–7.0)
Neutrophils: 62 %
Platelets: 200 10*3/uL (ref 150–450)
RBC: 5.62 x10E6/uL (ref 4.14–5.80)
RDW: 13 % (ref 11.6–15.4)
WBC: 4.5 10*3/uL (ref 3.4–10.8)

## 2020-06-25 NOTE — Telephone Encounter (Signed)
I called [facility] support.  Patient had his assessment on April 22 but the results are not available yet.  They did receive the PA and appeal denial. The bridge program would be the best option for him since he has Pharmacist, community.  He can get zeposia for 24 months in the bridge program.They will fax [place] the results of the assessments when they are available.  I will continue to follow

## 2020-06-27 NOTE — Telephone Encounter (Signed)
Received results, gave to MD to review/sign clearance form if cleared

## 2020-06-27 NOTE — Telephone Encounter (Signed)
Pt has called asking the following message be forwarded to RN re: the Zeposia .  Pt states a prescription is needed for the [facility] program.  Also The base line assessment clearance form needs faxing.  Pt stated RN should have the fax needed.

## 2020-06-27 NOTE — Telephone Encounter (Addendum)
Called [facility] support at [phone]. Spoke with Lenore. Her extension: [phone] She confirmed assessments loaded into their system this am at 9am. They are unable to send [place] a copy for MD to review. We have to call company that completed test directly ([employer]). Their phone# [phone]. P# [id number]. Confirmed eye exam/labs/ECG completed. She will also fax [place] baseline assessment clearance form to [phone] for Dr. Felecia Shelling to complete once he is able to review results  I called number above and spoke w/ Liana. She states they faxed results to [phone]. Advised we did not receive. She will have [facility] [place] re-fax them to [phone].

## 2020-06-27 NOTE — Telephone Encounter (Signed)
Received clearance form, waiting on results.

## 2020-06-28 NOTE — Telephone Encounter (Signed)
I signed the form.

## 2020-07-01 NOTE — Telephone Encounter (Signed)
Faxed clearance form to [facility] [employer].  Faxed [facility] for [employer] to [employer].  Received a receipt of confirmation.

## 2020-07-04 ENCOUNTER — Ambulatory Visit (INDEPENDENT_AMBULATORY_CARE_PROVIDER_SITE_OTHER): Payer: No Typology Code available for payment source | Admitting: Pulmonary Disease

## 2020-07-04 ENCOUNTER — Other Ambulatory Visit: Payer: Self-pay

## 2020-07-04 ENCOUNTER — Encounter: Payer: Self-pay | Admitting: Pulmonary Disease

## 2020-07-04 VITALS — BP 118/78 | HR 86 | Temp 97.9°F | Ht 72.0 in | Wt 239.4 lb

## 2020-07-04 DIAGNOSIS — G4733 Obstructive sleep apnea (adult) (pediatric): Secondary | ICD-10-CM

## 2020-07-04 NOTE — Progress Notes (Signed)
Austin Mclean    [id number]    [birth year]  Primary Care Physician:Ross, Dwyane Luo, MD  Referring Physician: [name], MD [place],  [place] [id number]  Chief complaint:    Follow-up for obstructive sleep apnea  HPI:  Had a repeat sleep study done Study did reveal mild sleep apnea with no significant oxygen desaturations  Patient felt he did not sleep well that night and study may not have represented what actually happened However does not have any daytime symptoms Not waking up tired Functioning well during the day  He has lost over 70 pounds and continues to stay healthy  Recent diagnosis of MS, started on meds  Has some sore throat, redness, dysphagia Breathing is fine Has no cough  Denies any significant respiratory complaints  History of obstructive sleep apnea-compliant with CPAP use Denies any chest pains or chest discomfort  No history of heart disease  Sleeps well, functions well Has managed to lose over 70 pounds since her last visit He walks regularly  Outpatient Encounter Medications as of 07/04/2020  Medication Sig  . buPROPion (ZYBAN) 150 MG 12 hr tablet Take 150 mg by mouth once.  . Cholecalciferol (VITAMIN D3) 50 MCG (2000 UT) TABS 2 tablets  . Cyanocobalamin (VITAMIN B12) 1000 MCG TBCR 1 tablet  . rivaroxaban (XARELTO) 10 MG TABS tablet Take 10 mg by mouth daily.  . vitamin C (ASCORBIC ACID) 250 MG tablet 1 tablet  . Zinc 50 MG TABS 1 tablet   No facility-administered encounter medications on file as of 07/04/2020.    Allergies as of 07/04/2020  . (No Known Allergies)    Past Medical History:  Diagnosis Date  . Allergy   . Anxiety   . Colon polyps    pseudopolyps  . Crohn disease ([place])    inactive x 10 years  . Optic neuritis   . Pulmonary embolism ([place]) 2010   bilateral  . Sleep apnea     Past Surgical History:  Procedure Laterality Date  . COLONOSCOPY    . POLYPECTOMY    . unremarkable       Family History  Problem Relation Age of Onset  . Colon cancer Neg Hx   . Colon polyps Neg Hx   . Rectal cancer Neg Hx   . Stomach cancer Neg Hx   . Diabetes type I Cousin   . Cancer Other        grandparent?    Social History   Socioeconomic History  . Marital status: Married    Spouse name: Not on file  . Number of children: 2  . Years of education: Not on file  . Highest education level: Not on file  Occupational History  . Occupation: Drug Stage manager: [employer]  Tobacco Use  . Smoking status: Former Smoker    Packs/day: 0.50    Years: 3.00    Pack years: 1.50    Types: Cigarettes    Quit date: 03/02/2000    Years since quitting: 20.3  . Smokeless tobacco: Never Used  . Tobacco comment: only in college years---social/occassional  Substance and Sexual Activity  . Alcohol use: Yes    Alcohol/week: 0.0 standard drinks    Comment: very rare  . Drug use: No  . Sexual activity: Not on file  Other Topics Concern  . Not on file  Social History Narrative  . Not on file   Social  Determinants of Health   Financial Resource Strain: Not on file  Food Insecurity: Not on file  Transportation Needs: Not on file  Physical Activity: Not on file  Stress: Not on file  Social Connections: Not on file  Intimate Partner Violence: Not on file    Review of Systems  Constitutional: Negative.   HENT: Negative for sore throat.        Nasal stuffiness  Eyes: Negative.   Respiratory: Positive for apnea. Negative for shortness of breath.   Cardiovascular: Negative.  Negative for chest pain and leg swelling.  Gastrointestinal: Negative.   All other systems reviewed and are negative.   Vitals:   07/04/20 1554  BP: 118/78  Pulse: 86  Temp: 97.9 F (36.6 C)  SpO2: 95%     Physical Exam Constitutional:      Appearance: He is well-developed.  HENT:     Head: Normocephalic and atraumatic.  Eyes:     General:        Right eye: No discharge.         Left eye: No discharge.  Neck:     Thyroid: No thyromegaly.     Trachea: No tracheal deviation.  Cardiovascular:     Rate and Rhythm: Normal rate and regular rhythm.  Pulmonary:     Effort: Pulmonary effort is normal. No respiratory distress.     Breath sounds: Normal breath sounds. No wheezing, rhonchi or rales.  Chest:     Chest wall: No tenderness.  Abdominal:     Palpations: Abdomen is soft.  Musculoskeletal:     Cervical back: No rigidity or tenderness.  Neurological:     Mental Status: He is alert.  Psychiatric:        Mood and Affect: Mood normal.    Data Reviewed: Previous sleep studies from 2014 showing mild obstructive sleep apnea Has been on CPAP therapy Compliance data not available today  Repeat home sleep study shows mild obstructive sleep apnea Number of events 6.8 as compared to 8 during his last study -Feels study was suboptimal  PFT 04/08/2018-no obstruction, no restriction, normal diffusing capacity Echocardiogram 02/17/2018-diastolic dysfunction  Recent chest x-ray shows no acute infiltrate  Assessment:   Obstructive sleep apnea -Not having any significant symptoms -Continues to lose weight  Recent diagnosis of MS  Plan/Recommendations:  With no significant symptoms of sleep apnea  Watchful waiting is appropriate  I will see him back as needed  Sherrilyn Rist MD [facility] 07/04/2020, 4:02 PM  CC: Austin Cruel, MD

## 2020-07-09 NOTE — Telephone Encounter (Signed)
I called patient.  He has started [place] and is tolerating it well.  He has no further questions or concerns.  He scheduled his follow-up for October 24, 2020 at 8:30 AM.

## 2020-09-05 ENCOUNTER — Encounter: Payer: Self-pay | Admitting: Internal Medicine

## 2020-09-11 ENCOUNTER — Encounter: Payer: Self-pay | Admitting: Internal Medicine

## 2020-10-24 ENCOUNTER — Ambulatory Visit: Payer: Self-pay | Admitting: Neurology

## 2020-10-25 ENCOUNTER — Ambulatory Visit: Payer: No Typology Code available for payment source | Admitting: Internal Medicine

## 2020-10-28 ENCOUNTER — Ambulatory Visit: Payer: No Typology Code available for payment source | Admitting: Neurology

## 2020-11-11 ENCOUNTER — Encounter: Payer: Self-pay | Admitting: Neurology

## 2020-11-11 ENCOUNTER — Ambulatory Visit (INDEPENDENT_AMBULATORY_CARE_PROVIDER_SITE_OTHER): Payer: No Typology Code available for payment source | Admitting: Neurology

## 2020-11-11 ENCOUNTER — Other Ambulatory Visit: Payer: Self-pay

## 2020-11-11 VITALS — BP 128/80 | HR 65 | Ht 73.0 in | Wt 238.5 lb

## 2020-11-11 DIAGNOSIS — R200 Anesthesia of skin: Secondary | ICD-10-CM

## 2020-11-11 DIAGNOSIS — Z79899 Other long term (current) drug therapy: Secondary | ICD-10-CM | POA: Diagnosis not present

## 2020-11-11 DIAGNOSIS — G35 Multiple sclerosis: Secondary | ICD-10-CM | POA: Diagnosis not present

## 2020-11-11 DIAGNOSIS — G35D Multiple sclerosis, unspecified: Secondary | ICD-10-CM

## 2020-11-11 DIAGNOSIS — Z8669 Personal history of other diseases of the nervous system and sense organs: Secondary | ICD-10-CM | POA: Diagnosis not present

## 2020-11-11 NOTE — Progress Notes (Signed)
[facility]  PATIENT: Austin Mclean DOB: [birth year]  REFERRING DOCTOR OR PCP: [name], MD SOURCE: Patient, notes from primary care  _________________________________   HISTORICAL  CHIEF COMPLAINT:  Chief Complaint  Patient presents with   Follow-up    Room 1. Pt alone and says he's doing well.     HISTORY OF PRESENT ILLNESS:   Austin Mclean is a 50 year old man with MS diagnosed in 04/2020.  He also has Crohn's disease.    Update  11/11/2020: The March 2022 brain MRI showed four new lesions on the current study that were not present on the previous study.  The MRI of the cervical spine did not show any MS plaque.  The MRI of the thoracic spine showed 3 plaques consistent with MS, none present on the 2012 study.    The combination of optic neuritis, characteristics of the MRI lesions in the supratentorial, infratentorial white matter and spinal cord as well as the change over time is consistent with clinically definite multiple sclerosis.   [name] was started in April 2022.   His MS seems stable.    He has had mild Crohn's and has not needed any medication x many years.     Gait is doing well and he walks 5 to 6 miles a day.  He denies any difficulties with strength.  Sensation is usually good though he sometimes has arm numbness.  Bladder function is fine.  He notes some difficulty with ADD.      He is in Research scientist (life sciences))  MS history: He had left  optic neuritis in 2012.  An MRI of the brain was abnormal but MRI of the spinal cord was normal.  He was told that he might have MS.  He felt the vision improved a little bit but not to baseline and he continues to have reduced vision out of that eye.  He never followed up for additional MRIs.  Since then, he denies any definite exacerbations but has had some neurologic symptoms at times.  Specifically, he has had some twitching and numbness in the right arm but none now.     He has incomplete bladder emptying.    Vision remains worse on the left.  However, color vision is symmetric.   He is on Xarelto for a bilateral PE 7 years ago.  He noted a possible blood clot in his leg that occurred after a long flight to [facility].   He is fairly active.   He used to be overweight but now exercises.     Imaging: MRI of the brain 05/04/2020 showed multiple T2/FLAIR hyperintense foci in the hemispheres, right thalamus, and one focus each in the cerebellum and pons.  4 of the foci in the hemispheres were not present in 2012.  MRI of the cervical spine 05/16/2020 shows a normal spinal cord and multilevel degenerative changes with mild spinal stenosis at C4-C5 through C6-C7.  The degenerative changes have progressed some compared to the 12/26/2010 MRI.  MRI of the thoracic spine 05/16/2020 showed foci within the spinal cord adjacent to T4-T5 anteriorly, centrally adjacent to T6-T7 and posterolaterally to the left and centrally adjacent to T10.  None of these foci were present on the MRI of the thoracic spine from 12/26/2010.  REVIEW OF SYSTEMS: Constitutional: No fevers, chills, sweats, or change in appetite Eyes: Persistent left visual changes.  No double vision, eye pain Ear, nose and throat: No hearing loss,  ear pain, nasal congestion, sore throat Cardiovascular: No chest pain, palpitations Respiratory:  No shortness of breath at rest or with exertion.   No wheezes GastrointestinaI: No nausea, vomiting, diarrhea, abdominal pain, fecal incontinence Genitourinary:  No dysuria, urinary retention or frequency.  No nocturia. Musculoskeletal:  No neck pain, back pain Integumentary: No rash, pruritus, skin lesions Neurological: as above Psychiatric: No depression at this time.  No anxiety Endocrine: No palpitations, diaphoresis, change in appetite, change in weigh or increased thirst Hematologic/Lymphatic:  No anemia, purpura, petechiae.  He is on Eliquis for pulmonary  embolism Allergic/Immunologic: No itchy/runny eyes, nasal congestion, recent allergic reactions, rashes  ALLERGIES: No Known Allergies  HOME MEDICATIONS:  Current Outpatient Medications:    buPROPion (ZYBAN) 150 MG 12 hr tablet, Take 150 mg by mouth once., Disp: , Rfl:    Cholecalciferol (VITAMIN D3) 50 MCG (2000 UT) TABS, 2 tablets, Disp: , Rfl:    Cyanocobalamin (VITAMIN B12) 1000 MCG TBCR, 1 tablet, Disp: , Rfl:    Ozanimod HCl (ZEPOSIA) 0.92 MG CAPS, Take 0.92 mg by mouth daily., Disp: , Rfl:    rivaroxaban (XARELTO) 10 MG TABS tablet, Take 10 mg by mouth daily., Disp: , Rfl:    vitamin C (ASCORBIC ACID) 250 MG tablet, 1 tablet, Disp: , Rfl:    Zinc 50 MG TABS, 1 tablet, Disp: , Rfl:   PAST MEDICAL HISTORY: Past Medical History:  Diagnosis Date   Allergy    Anxiety    Colon polyps    pseudopolyps   Crohn disease ([place])    inactive x 10 years   Optic neuritis    Pulmonary embolism ([place]) 2010   bilateral   Sleep apnea     PAST SURGICAL HISTORY: Past Surgical History:  Procedure Laterality Date   COLONOSCOPY     POLYPECTOMY     unremarkable      FAMILY HISTORY: Family History  Problem Relation Age of Onset   Colon cancer Neg Hx    Colon polyps Neg Hx    Rectal cancer Neg Hx    Stomach cancer Neg Hx    Diabetes type I Cousin    Cancer Other        grandparent?    SOCIAL HISTORY:  Social History   Socioeconomic History   Marital status: Married    Spouse name: Not on file   Number of children: 2   Years of education: Not on file   Highest education level: Not on file  Occupational History   Occupation: Drug representative    Employer: [employer]  Tobacco Use   Smoking status: Former    Packs/day: 0.50    Years: 3.00    Pack years: 1.50    Types: Cigarettes    Quit date: 03/02/2000    Years since quitting: 20.7   Smokeless tobacco: Never   Tobacco comments:    only in college years---social/occassional  Substance and Sexual Activity    Alcohol use: Yes    Alcohol/week: 0.0 standard drinks    Comment: very rare   Drug use: No   Sexual activity: Not on file  Other Topics Concern   Not on file  Social History Narrative   Not on file   Social Determinants of Health   Financial Resource Strain: Not on file  Food Insecurity: Not on file  Transportation Needs: Not on file  Physical Activity: Not on file  Stress: Not on file  Social Connections: Not on file  Intimate Partner Violence: Not  on file     PHYSICAL EXAM  Vitals:   11/11/20 1520  BP: 128/80  Pulse: 65  Weight: 238 lb 8 oz (108.2 kg)  Height: _0  (1.854 m)    Body mass index is 31.47 kg/m.  Visual acuity was 20/20 OD and 20/50 OS  General: The patient is well-developed and well-nourished and in no acute distress  HEENT:  Head is [place]/AT.  Sclera are anicteric.   Skin: Extremities are without rash or  edema.  Neurologic Exam  Mental status: The patient is alert and oriented x 3 at the time of the examination. The patient has apparent normal recent and remote memory, with an apparently normal attention span and concentration ability.   Speech is normal.  Cranial nerves: Extraocular movements are full.  He has a 1+ left APD.  Patient strength and sensation was normal.  No obvious hearing deficits are noted.  Motor:  Muscle bulk is normal.   Tone is normal. Strength is  5 / 5 in all 4 extremities.   Sensory: Sensory testing is intact to soft touch and vibration sensation in all 4 extremities.  Coordination: Cerebellar testing reveals good finger-nose-finger and heel-to-shin bilaterally.  Gait and station: Station is normal.   Gait is normal. Tandem gait is slightly wide. Romberg is negative.   Reflexes: Deep tendon reflexes are symmetric and normal bilaterally.        DIAGNOSTIC DATA (LABS, IMAGING, TESTING) - I reviewed patient records, labs, notes, testing and imaging myself where available.  Lab Results  Component Value Date   WBC  4.5 06/21/2020   HGB 16.1 06/21/2020   HCT 48.2 06/21/2020   MCV 86 06/21/2020   PLT 200 06/21/2020      Component Value Date/Time   NA 140 06/17/2010 1330   K 4.1 06/17/2010 1330   CL 105 06/17/2010 1330   CO2 28 06/17/2010 1330   GLUCOSE 103 (H) 06/17/2010 1330   BUN 8 06/17/2010 1330   CREATININE 1.20 06/17/2010 1330   CALCIUM 9.3 06/17/2010 1330   PROT 6.8 06/21/2020 1627   ALBUMIN 4.5 06/21/2020 1627   AST 12 06/21/2020 1627   ALT 13 06/21/2020 1627   ALKPHOS 45 06/21/2020 1627   BILITOT 0.6 06/21/2020 1627   GFRNONAA >60 06/17/2010 1330   GFRAA  06/17/2010 1330    >60        The eGFR has been calculated using the MDRD equation. This calculation has not been validated in all clinical situations. eGFR's persistently <60 mL/min signify possible Chronic Kidney Disease.       ASSESSMENT AND PLAN  Multiple sclerosis ([place]) - Plan: CBC with Differential/Platelet, Hepatic function panel  High risk medication use - Plan: CBC with Differential/Platelet, Hepatic function panel  History of optic neuritis  Arm numbness  1.  Continue Zeposia for MS disease modifying therapy.  This may also help Crohn's disease.  We will check labs.  Sometime next year we will need to check an MRI of the brain to determine if there is any breakthrough activity.  If present we may need to consider different disease modifying therapy. 2.    Stay active.  We discussed good diet and taking vitamin D 3.   Return in 3 months or sooner if there are new or worsening neurologic symptoms.    Druanne Bosques A. Felecia Shelling, MD, [facility] 5/63/1497, 0:26 PM Certified in Neurology, Clinical Neurophysiology, Sleep Medicine and Neuroimaging  [facility] [address] [place], [place] [address] [phone]

## 2020-11-12 ENCOUNTER — Telehealth: Payer: Self-pay

## 2020-11-12 LAB — CBC WITH DIFFERENTIAL/PLATELET
Basophils Absolute: 0 10*3/uL (ref 0.0–0.2)
Basos: 1 %
EOS (ABSOLUTE): 0 10*3/uL (ref 0.0–0.4)
Eos: 0 %
Hematocrit: 47.6 % (ref 37.5–51.0)
Hemoglobin: 16.1 g/dL (ref 13.0–17.7)
Immature Grans (Abs): 0 10*3/uL (ref 0.0–0.1)
Immature Granulocytes: 0 %
Lymphocytes Absolute: 0.3 10*3/uL — ABNORMAL LOW (ref 0.7–3.1)
Lymphs: 7 %
MCH: 29 pg (ref 26.6–33.0)
MCHC: 33.8 g/dL (ref 31.5–35.7)
MCV: 86 fL (ref 79–97)
Monocytes Absolute: 0.4 10*3/uL (ref 0.1–0.9)
Monocytes: 10 %
Neutrophils Absolute: 3 10*3/uL (ref 1.4–7.0)
Neutrophils: 82 %
Platelets: 175 10*3/uL (ref 150–450)
RBC: 5.55 x10E6/uL (ref 4.14–5.80)
RDW: 13.2 % (ref 11.6–15.4)
WBC: 3.7 10*3/uL (ref 3.4–10.8)

## 2020-11-12 LAB — HEPATIC FUNCTION PANEL
ALT: 27 IU/L (ref 0–44)
AST: 18 IU/L (ref 0–40)
Albumin: 4.3 g/dL (ref 4.0–5.0)
Alkaline Phosphatase: 39 IU/L — ABNORMAL LOW (ref 44–121)
Bilirubin Total: 0.5 mg/dL (ref 0.0–1.2)
Bilirubin, Direct: 0.15 mg/dL (ref 0.00–0.40)
Total Protein: 6.6 g/dL (ref 6.0–8.5)

## 2020-11-12 NOTE — Telephone Encounter (Signed)
I called Tysheria at [facility] support hub asking for clarification on what they need. No answer, left a message asking her to call us back.

## 2020-11-12 NOTE — Telephone Encounter (Signed)
-----   Message from Britt Bottom, MD sent at 11/11/2020  3:54 PM EDT ----- Regarding: zeposia issues He is on free drug but is getting phone calls from [employer] [employer] that there should be an approval letter written to [place] that they needs a copy of.    Could you look into this

## 2020-11-13 NOTE — Telephone Encounter (Signed)
Received this notice from [place] at [phone]: "Good news.nothing is needed for this patient.  Apparently, a new benefit verification was created for [facility] and for some reason it said a letter of medical necessity was needed.  But, the Nurse Navigator from [phone] hub spoke with patient on 9/8 and informed him that no letter was needed.  His most recent Bridge shipment was delivered on 9/12."

## 2020-11-27 ENCOUNTER — Ambulatory Visit (INDEPENDENT_AMBULATORY_CARE_PROVIDER_SITE_OTHER): Payer: No Typology Code available for payment source | Admitting: Internal Medicine

## 2020-11-27 ENCOUNTER — Encounter: Payer: Self-pay | Admitting: Internal Medicine

## 2020-11-27 VITALS — BP 120/60 | HR 68 | Ht 72.0 in | Wt 238.0 lb

## 2020-11-27 DIAGNOSIS — E538 Deficiency of other specified B group vitamins: Secondary | ICD-10-CM

## 2020-11-27 DIAGNOSIS — Z7901 Long term (current) use of anticoagulants: Secondary | ICD-10-CM

## 2020-11-27 DIAGNOSIS — K50119 Crohn's disease of large intestine with unspecified complications: Secondary | ICD-10-CM

## 2020-11-27 MED ORDER — SUTAB 1479-225-188 MG PO TABS: 1.0000 | ORAL_TABLET | Freq: Once | ORAL | 0 refills | Status: AC

## 2020-11-27 NOTE — Progress Notes (Signed)
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  The patient was last evaluated in this office August 22, 2015 with lower abdominal discomfort.  CT scan of the abdomen and pelvis was unremarkable.  Subsequent colonoscopy performed September 18, 2015 revealed deformity at the ileocecal valve.  The ileum and colon were otherwise normal without evidence of Crohn's disease.  Biopsies revealed no significant abnormalities.  We did interrupt his Xarelto transiently for his procedure.  He has had no interval medical problems.  He is pleased to report 100 pound weight loss with exercise and diet.  He does receive B12 replacement therapy.  He is on no medical therapy for his Crohn's, but rather active surveillance.  Review of blood work from November 11, 2020 shows normal liver test.  Normal CBC with hemoglobin 16.1.  REVIEW OF SYSTEMS:  All non-GI ROS negative unless otherwise stated in the HPI.  Past Medical History:  Diagnosis Date   Allergy    Anxiety    Colon polyps    pseudopolyps   Crohn disease ([place])    inactive x 10 years   Optic neuritis    Pulmonary embolism ([place]) 2010   bilateral   Sleep apnea     Past Surgical History:  Procedure Laterality Date   COLONOSCOPY     POLYPECTOMY      Social History Austin Mclean  reports that he has never smoked. He has never used smokeless tobacco. He reports current alcohol use. He reports that he does not use drugs.  family history includes Cancer in an other family member; Diabetes type I in his cousin; Other in his father.  No Known Allergies     PHYSICAL EXAMINATION: Vital signs: BP 120/60   Pulse 68   Ht 6' (1.829 m)   Wt 238 lb (108 kg)   BMI 32.28 kg/m   Constitutional: generally well-appearing, no acute distress Psychiatric: alert and oriented x3, cooperative Eyes: extraocular  movements intact, anicteric, conjunctiva pink Mouth: Mask Neck: supple no lymphadenopathy Cardiovascular: heart regular rate and rhythm, no murmur Lungs: clear to auscultation bilaterally Abdomen: soft, nontender, nondistended, no obvious ascites, no peritoneal signs, normal bowel sounds, no organomegaly Rectal: Deferred until colonoscopy Extremities: no clubbing, cyanosis, or lower extremity edema bilaterally Skin: no lesions on visible extremities Neuro: No focal deficits.  Cranial nerves intact  ASSESSMENT:  1.  History of ileal Crohn's.  Asymptomatic.  Appears to be in remission.  On no medical therapy.  Last colonoscopy 2017. 2.  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  Has been followed by [facility] hematology 3.  History of B12 deficiency.  On replacement.  Normal CBC   PLAN:  1.  Surveillance colonoscopy.  The patient is high risk given the need to address anticoagulation.The nature of the procedure, as well as the risks, benefits, and alternatives were carefully and thoroughly reviewed with the patient. Ample time for discussion and questions allowed. The patient understood, was satisfied, and agreed to proceed.  2.  Hold Xarelto the day prior to the procedure as well as the day of the procedure.  Would anticipate resumption of anticoagulation immediately post procedure.  He will check with his hematologist, again, to assure that this is acceptable as it was last colonoscopy. 3.  Continue with B12 replacement and monitoring of B12 levels with your  PCP as you are doing

## 2020-11-27 NOTE — Patient Instructions (Signed)
If you are age 48 or older, your body mass index should be between 23-30. Your Body mass index is 32.28 kg/m. If this is out of the aforementioned range listed, please consider follow up with your Primary Care Provider.  If you are age 108 or younger, your body mass index should be between 19-25. Your Body mass index is 32.28 kg/m. If this is out of the aformentioned range listed, please consider follow up with your Primary Care Provider.   __________________________________________________________  The [facility] providers would like to encourage you to use [facility] to communicate with providers for non-urgent requests or questions.  Due to long hold times on the telephone, sending your provider a message by [facility] may be a faster and more efficient way to get a response.  Please allow 48 business hours for a response.  Please remember that this is for non-urgent requests.   You have been scheduled for a colonoscopy. Please follow written instructions given to you at your visit today.  Please pick up your prep supplies at the pharmacy within the next 1-3 days. If you use inhalers (even only as needed), please bring them with you on the day of your procedure.

## 2020-12-10 NOTE — Telephone Encounter (Signed)
Flora with [facility] called wanting to know if pt needed starter titration. There was question about the prescription. Call back number to pharmacist [phone].

## 2020-12-10 NOTE — Telephone Encounter (Signed)
I called [facility]. I spoke with a pharmacist. I confirmed the RX for zeposia and that patient should not need titration since he has been taking zeposia.

## 2020-12-11 ENCOUNTER — Telehealth: Payer: Self-pay | Admitting: Neurology

## 2020-12-11 NOTE — Telephone Encounter (Signed)
Courtney @ [facility] has called to inform a PA is needed for pt's zeposia. Call back # is [phone]

## 2020-12-11 NOTE — Telephone Encounter (Signed)
Tried submitted PA on CMM. Key: [id number]. Received the following response: "PA has already submitted and is in process for this patient and drug.;[id number];WZLYTS:In Process;"   Sent email to [facility] support rep: Malena Edman to see if pt is still receiving free drug or not as well.

## 2020-12-11 NOTE — Telephone Encounter (Signed)
Received response from [facility]. "I just reached out to [place] hub for clarity.  I also see that the patient has been receiving bridge, I'll let you know what I find out asap!"

## 2020-12-12 NOTE — Telephone Encounter (Signed)
Received PA to complete via fax from [facility]. Faxed completed/signed PA back at [phone]. Received fax confirmation, waiting on determination.

## 2020-12-12 NOTE — Telephone Encounter (Signed)
Received email update from [place]: "When a patient is on bridge medication, [employer] conducts monthly benefit verifications to check for insurance coverage change.  When they completed the benefit reverification this month, insurance stated that Austin Mclean is covered now with no PA required. So, the patient will now receive maintenance drug. Did [facility] tell you a PA was required"  I replied back and informed her we have received duplicate requests. Got a fax today from [facility] requesting a PA. Completed and waiting on determination. I informed Janett Billow. Should would like update on determination once its back so that she can then inform hub. Her email: [email or web address]

## 2020-12-16 NOTE — Telephone Encounter (Signed)
Sent email to [employer]. With the approval info below to update hub:

## 2021-01-10 ENCOUNTER — Encounter: Payer: Self-pay | Admitting: Internal Medicine

## 2021-01-17 ENCOUNTER — Other Ambulatory Visit: Payer: Self-pay

## 2021-01-17 ENCOUNTER — Ambulatory Visit (AMBULATORY_SURGERY_CENTER): Payer: No Typology Code available for payment source | Admitting: Internal Medicine

## 2021-01-17 ENCOUNTER — Encounter: Payer: Self-pay | Admitting: Internal Medicine

## 2021-01-17 VITALS — BP 146/94 | HR 66 | Temp 97.3°F | Resp 12 | Ht 72.0 in | Wt 238.0 lb

## 2021-01-17 DIAGNOSIS — K5140 Inflammatory polyps of colon without complications: Secondary | ICD-10-CM

## 2021-01-17 DIAGNOSIS — K50119 Crohn's disease of large intestine with unspecified complications: Secondary | ICD-10-CM

## 2021-01-17 DIAGNOSIS — D120 Benign neoplasm of cecum: Secondary | ICD-10-CM

## 2021-01-17 MED ORDER — SODIUM CHLORIDE 0.9 % IV SOLN: 500.0000 mL | Freq: Once | INTRAVENOUS | Status: DC

## 2021-01-17 NOTE — Progress Notes (Signed)
Called to room to assist during endoscopic procedure.  Patient ID and intended procedure confirmed with present staff. Received instructions for my participation in the procedure from the performing physician.  

## 2021-01-17 NOTE — Progress Notes (Signed)
HISTORY OF PRESENT ILLNESS:  Austin Mclean is a 50 y.o. male who was evaluated in the office November 27, 2020 regarding history of ileal Crohn's and the need for surveillance colonoscopy.  See that dictation.  He has a history of DVT on Xarelto.  This has been held short-term for the procedure.  No interval changes  REVIEW OF SYSTEMS:  All non-GI ROS negative. Past Medical History:  Diagnosis Date   Allergy    Anxiety    Colon polyps    pseudopolyps   Crohn disease ([place])    inactive x 10 years   Optic neuritis    Pulmonary embolism ([place]) 2010   bilateral   Sleep apnea     Past Surgical History:  Procedure Laterality Date   COLONOSCOPY     POLYPECTOMY      Social History Austin Mclean  reports that he has never smoked. He has never used smokeless tobacco. He reports current alcohol use. He reports that he does not use drugs.  family history includes Cancer in an other family member; Diabetes type I in his cousin; Other in his father.  No Known Allergies     PHYSICAL EXAMINATION:  Vital signs: BP 138/82   Pulse 70   Temp (!) 97.3 F (36.3 C) (Temporal)   Resp 13   Ht 6' (1.829 m)   Wt 238 lb (108 kg)   SpO2 99%   BMI 32.28 kg/m  General: Well-developed, well-nourished, no acute distress HEENT: Sclerae are anicteric, conjunctiva pink. Oral mucosa intact Lungs: Clear Heart: Regular Abdomen: soft, nontender, nondistended, no obvious ascites, no peritoneal signs, normal bowel sounds. No organomegaly. Extremities: No edema Psychiatric: alert and oriented x3. Cooperative     ASSESSMENT:   1.  History of ileal Crohn's.  Asymptomatic.  Appears to be in remission.  On no medical therapy.  Last colonoscopy 2017. 2.  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  Has been followed by [facility] hematology 3.  History of B12 deficiency.  On replacement.  Normal CBC     PLAN:   1.  Surveillance colonoscopy.  The patient is high  risk given the need to address anticoagulation.The nature of the procedure, as well as the risks, benefits, and alternatives were carefully and thoroughly reviewed with the patient. Ample time for discussion and questions allowed. The patient understood, was satisfied, and agreed to proceed.  2.  Hold Xarelto the day prior to the procedure as well as the day of the procedure.  Would anticipate resumption of anticoagulation immediately post procedure.  He will check with his hematologist, again, to assure that this is acceptable as it was last colonoscopy. 3.  Continue with B12 replacement and monitoring of B12 levels with your PCP as you are doing

## 2021-01-17 NOTE — Op Note (Signed)
[facility] Patient Name: Austin Mclean Procedure Date: 01/17/2021 2:31 PM MRN: [id number] Endoscopist: Docia Chuck. Henrene Pastor , MD Age: 50 Referring MD:  Date of Birth: [birth year] Gender: Male Account #: [id number] Procedure:                Colonoscopy with cold snare polypectomy x 1; with                            biopsies Indications:              High risk colon cancer surveillance: History of                            ileal Crohn's. Under active surveillance. Last                            colonoscopy 2017 was negative for active disease.                            The patient has been feeling. Medicines:                Monitored Anesthesia Care Procedure:                Pre-Anesthesia Assessment:                           - Prior to the procedure, a History and Physical                            was performed, and patient medications and                            allergies were reviewed. The patient's tolerance of                            previous anesthesia was also reviewed. The risks                            and benefits of the procedure and the sedation                            options and risks were discussed with the patient.                            All questions were answered, and informed consent                            was obtained. Prior Anticoagulants: The patient has                            taken Xarelto (rivaroxaban), last dose was 2 days                            prior to procedure. ASA Grade Assessment: II - A  patient with mild systemic disease. After reviewing                            the risks and benefits, the patient was deemed in                            satisfactory condition to undergo the procedure.                           After obtaining informed consent, the colonoscope                            was passed under direct vision. Throughout the                            procedure, the patient's blood  pressure, pulse, and                            oxygen saturations were monitored continuously. The                            CF [id number] #[id number] was introduced through the anus                            and advanced to the the cecum, identified by                            appendiceal orifice and ileocecal valve. The                            terminal ileum, ileocecal valve, appendiceal                            orifice, and rectum were photographed. The quality                            of the bowel preparation was excellent. The                            colonoscopy was performed without difficulty. The                            patient tolerated the procedure well. The bowel                            preparation used was SUPREP via split dose                            instruction. Scope In: 2:44:33 PM Scope Out: 3:01:22 PM Scope Withdrawal Time: 0 hours 13 minutes 58 seconds  Total Procedure Duration: 0 hours 16 minutes 49 seconds  Findings:                 The terminal ileum was deeply intubated (greater  than 10 cm) and appeared normal.                           A 4 mm polyp was found in the ileocecal valve. The                            polyp was removed with a cold snare. Resection and                            retrieval were complete.                           There was deformity at the area of the ileocecal                            valve. 2 obvious pseudopolyps. These were biopsied                            for confirmation. The entire examined colon                            appeared otherwise normal on direct and                            retroflexion views.Okay Complications:            No immediate complications. Estimated blood loss:                            None. Estimated Blood Loss:     Estimated blood loss: none. Impression:               - The examined portion of the ileum was normal.                           - One 4 mm  polyp at the ileocecal valve, removed                            with a cold snare. Resected and retrieved.                           - Deformity and pseudopolyposis at the ileocecal                            valve. No evidence of active Crohn's disease. The                            colon was otherwise normal. Recommendation:           - Repeat colonoscopy in 5 years for surveillance.                           - Resume Xarelto (rivaroxaban) today at prior dose.                           -  Patient has a contact number available for                            emergencies. The signs and symptoms of potential                            delayed complications were discussed with the                            patient. Return to normal activities tomorrow.                            Written discharge instructions were provided to the                            patient.                           - Resume previous diet.                           - Continue present medications.                           - Await pathology results. Docia Chuck. Henrene Pastor, MD 01/17/2021 3:13:24 PM This report has been signed electronically.

## 2021-01-17 NOTE — Patient Instructions (Signed)
Impression/Recommendations:  Polyp handout given to patient.  Repeat colonoscopy in 5 years for surveillance.  Resume Xarelto (rivaroxaban) today at prior dose.  Resume previous diet. Continue present medications. Await pathology results.  YOU HAD AN ENDOSCOPIC PROCEDURE TODAY AT [facility]:   Refer to the procedure report that was given to you for any specific questions about what was found during the examination.  If the procedure report does not answer your questions, please call your gastroenterologist to clarify.  If you requested that your care partner not be given the details of your procedure findings, then the procedure report has been included in a sealed envelope for you to review at your convenience later.  YOU SHOULD EXPECT: Some feelings of bloating in the abdomen. Passage of more gas than usual.  Walking can help get rid of the air that was put into your GI tract during the procedure and reduce the bloating. If you had a lower endoscopy (such as a colonoscopy or flexible sigmoidoscopy) you may notice spotting of blood in your stool or on the toilet paper. If you underwent a bowel prep for your procedure, you may not have a normal bowel movement for a few days.  Please Note:  You might notice some irritation and congestion in your nose or some drainage.  This is from the oxygen used during your procedure.  There is no need for concern and it should clear up in a day or so.  SYMPTOMS TO REPORT IMMEDIATELY:  Following lower endoscopy (colonoscopy or flexible sigmoidoscopy):  Excessive amounts of blood in the stool  Significant tenderness or worsening of abdominal pains  Swelling of the abdomen that is new, acute  Fever of 100F or higher For urgent or emergent issues, a gastroenterologist can be reached at any hour by calling [phone]. Do not use MyChart messaging for urgent concerns.    DIET:  We do recommend a small meal at first, but then you may  proceed to your regular diet.  Drink plenty of fluids but you should avoid alcoholic beverages for 24 hours.  ACTIVITY:  You should plan to take it easy for the rest of today and you should NOT DRIVE or use heavy machinery until tomorrow (because of the sedation medicines used during the test).    FOLLOW UP: Our staff will call the number listed on your records 48-72 hours following your procedure to check on you and address any questions or concerns that you may have regarding the information given to you following your procedure. If we do not reach you, we will leave a message.  We will attempt to reach you two times.  During this call, we will ask if you have developed any symptoms of COVID 19. If you develop any symptoms (ie: fever, flu-like symptoms, shortness of breath, cough etc.) before then, please call [phone].  If you test positive for Covid 19 in the 2 weeks post procedure, please call and report this information to [place].    If any biopsies were taken you will be contacted by phone or by letter within the next 1-3 weeks.  Please call us at [phone] if you have not heard about the biopsies in 3 weeks.    SIGNATURES/CONFIDENTIALITY: You and/or your care partner have signed paperwork which will be entered into your electronic medical record.  These signatures attest to the fact that that the information above on your After Visit Summary has been reviewed and is understood.  Full responsibility  of the confidentiality of this discharge information lies with you and/or your care-partner.

## 2021-01-17 NOTE — Progress Notes (Signed)
Pt's states no medical or surgical changes since previsit or office visit.   Vitals [place]

## 2021-01-17 NOTE — Progress Notes (Signed)
Report to PACU, RN, vss, BBS= Clear.  

## 2021-01-21 ENCOUNTER — Telehealth: Payer: Self-pay

## 2021-01-21 NOTE — Telephone Encounter (Signed)
Left message on answering machine. 

## 2021-01-27 ENCOUNTER — Encounter: Payer: Self-pay | Admitting: Internal Medicine

## 2021-05-13 ENCOUNTER — Ambulatory Visit: Payer: No Typology Code available for payment source | Admitting: Neurology

## 2021-06-03 ENCOUNTER — Encounter: Payer: Self-pay | Admitting: Neurology

## 2021-06-03 ENCOUNTER — Ambulatory Visit (INDEPENDENT_AMBULATORY_CARE_PROVIDER_SITE_OTHER): Payer: No Typology Code available for payment source | Admitting: Neurology

## 2021-06-03 ENCOUNTER — Telehealth: Payer: Self-pay | Admitting: Neurology

## 2021-06-03 VITALS — BP 136/81 | HR 60 | Ht 72.0 in | Wt 241.0 lb

## 2021-06-03 DIAGNOSIS — R3911 Hesitancy of micturition: Secondary | ICD-10-CM

## 2021-06-03 DIAGNOSIS — Z79899 Other long term (current) drug therapy: Secondary | ICD-10-CM

## 2021-06-03 DIAGNOSIS — G35 Multiple sclerosis: Secondary | ICD-10-CM | POA: Diagnosis not present

## 2021-06-03 DIAGNOSIS — Z8669 Personal history of other diseases of the nervous system and sense organs: Secondary | ICD-10-CM

## 2021-06-03 DIAGNOSIS — R269 Unspecified abnormalities of gait and mobility: Secondary | ICD-10-CM

## 2021-06-03 NOTE — Telephone Encounter (Signed)
[employer] order sent to [facility], they will obtain the auth and reach out to the patient to schedule.  ?

## 2021-06-03 NOTE — Progress Notes (Signed)
? ?[facility] ? ?PATIENT: Austin Mclean ?DOB: 1970-06-14 ? ?REFERRING DOCTOR OR PCP: [name], MD ?SOURCE: Patient, notes from primary care ? ?_________________________________ ? ? ?HISTORICAL ? ?CHIEF COMPLAINT:  ?Chief Complaint  ?Patient presents with  ? Follow-up  ?  Rm 1, alone. Here for 6 month MS f/u, on Zeposia and tolerating well. MS stable. No new or worsening in sx.   ? ? ?HISTORY OF PRESENT ILLNESS:  ?Austin Mclean is a 51 year old man with MS diagnosed in 04/2020.  He also has Crohn's disease.   ? ?Update  06/03/2021: ?He is doing well .  He is on Zeposia and has not had any exacerbations.   He still has some balance issues but this is stable.  Although he was diagnosed with MS in 2012, Austin Mclean is the first medication that he has been on.  It was started because the March 2022 brain MRI showed four new lesions on the current study that were not present on the previous study from 2012.  The MRI of the cervical spine did not show any MS plaque.  The MRI of the thoracic spine showed 3 plaques consistent with MS, none present on the 2012 study.    The combination of optic neuritis, characteristics of the MRI lesions in the supratentorial, infratentorial white matter and spinal cord as well as the change over time is consistent with clinically definite multiple sclerosis. ?  ?Austin Mclean was started in April 2022.   His MS seems stable.    He has had mild Crohn's and has not needed any medication x many years.   He feels it is stable.    Caffeine upsets his gut.   ? ?Gait is doing well and he walks 5 to 6 miles a day.  He denies any difficulties with strength.  Sensation is usually good though he sometimes has arm numbness.  He has urinary hesitancy but no straining.   ? ?He notes some difficulty with ADD.     ? 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 ?MS history: ?He had left  optic neuritis in 2012.  An MRI of the brain was abnormal but MRI of the spinal cord  was normal.  He was told that he might have MS.  He felt the vision improved a little bit but not to baseline and he continues to have reduced vision out of that eye.  He never followed up for additional MRIs.  Since then, he denies any definite exacerbations but has had some neurologic symptoms at times.  Specifically, he has had some twitching and numbness in the right arm but none now.    He has incomplete bladder emptying.    Vision remains worse on the left.  However, color vision is symmetric.  ? ?He is on Xarelto for a bilateral PE 7 years ago.  He noted a possible blood clot in his leg that occurred after a long flight to [facility].   He is fairly active.   He used to be overweight but now exercises.    ? ?Imaging: ?MRI of the brain 05/04/2020 showed multiple T2/FLAIR hyperintense foci in the hemispheres, right thalamus, and one focus each in the cerebellum and pons.  4 of the foci in the hemispheres were not present in 2012. ? ?MRI of the cervical spine 05/16/2020 shows a normal spinal cord and multilevel degenerative changes with mild spinal stenosis at C4-C5 through C6-C7.  The degenerative changes have progressed  some compared to the 12/26/2010 MRI. ? ?MRI of the thoracic spine 05/16/2020 showed foci within the spinal cord adjacent to T4-T5 anteriorly, centrally adjacent to T6-T7 and posterolaterally to the left and centrally adjacent to T10.  None of these foci were present on the MRI of the thoracic spine from 12/26/2010. ? ?REVIEW OF SYSTEMS: ?Constitutional: No fevers, chills, sweats, or change in appetite ?Eyes: Persistent left visual changes.  No double vision, eye pain ?Ear, nose and throat: No hearing loss, ear pain, nasal congestion, sore throat ?Cardiovascular: No chest pain, palpitations ?Respiratory:  No shortness of breath at rest or with exertion.   No wheezes ?GastrointestinaI: No nausea, vomiting, diarrhea, abdominal pain, fecal incontinence ?Genitourinary:  No dysuria, urinary retention or  frequency.  No nocturia. ?Musculoskeletal:  No neck pain, back pain ?Integumentary: No rash, pruritus, skin lesions ?Neurological: as above ?Psychiatric: No depression at this time.  No anxiety ?Endocrine: No palpitations, diaphoresis, change in appetite, change in weigh or increased thirst ?Hematologic/Lymphatic:  No anemia, purpura, petechiae.  He is on Eliquis for pulmonary embolism ?Allergic/Immunologic: No itchy/runny eyes, nasal congestion, recent allergic reactions, rashes ? ?ALLERGIES: ?No Known Allergies ? ?HOME MEDICATIONS: ? ?Current Outpatient Medications:  ?  buPROPion (ZYBAN) 150 MG 12 hr tablet, Take 150 mg by mouth once., Disp: , Rfl:  ?  Cholecalciferol (VITAMIN D3) 50 MCG (2000 UT) TABS, 2 tablets, Disp: , Rfl:  ?  Cyanocobalamin (VITAMIN B12) 1000 MCG TBCR, 1 tablet, Disp: , Rfl:  ?  Ozanimod HCl (ZEPOSIA) 0.92 MG CAPS, Take 0.92 mg by mouth daily., Disp: , Rfl:  ?  rivaroxaban (XARELTO) 10 MG TABS tablet, Take 10 mg by mouth daily., Disp: , Rfl:  ?  vitamin C (ASCORBIC ACID) 250 MG tablet, 1 tablet, Disp: , Rfl:  ?  Zinc 50 MG TABS, 1 tablet, Disp: , Rfl:  ? ?PAST MEDICAL HISTORY: ?Past Medical History:  ?Diagnosis Date  ? Allergy   ? Anxiety   ? Colon polyps   ? pseudopolyps  ? Crohn disease ([place])   ? inactive x 10 years  ? Optic neuritis   ? Pulmonary embolism ([place]) 2010  ? bilateral  ? Sleep apnea   ? ? ?PAST SURGICAL HISTORY: ?Past Surgical History:  ?Procedure Laterality Date  ? COLONOSCOPY    ? POLYPECTOMY    ? ? ?FAMILY HISTORY: ?Family History  ?Problem Relation Age of Onset  ? Other Father   ?     MVA  ? Diabetes type I Cousin   ? Cancer Other   ?     grandparent?  ? Colon cancer Neg Hx   ? Colon polyps Neg Hx   ? Rectal cancer Neg Hx   ? Stomach cancer Neg Hx   ? ? ?SOCIAL HISTORY: ? ?Social History  ? ?Socioeconomic History  ? Marital status: Divorced  ?  Spouse name: Not on file  ? Number of children: 2  ? Years of education: Not on file  ? Highest education level: Not on file   ?Occupational History  ? Occupation: Systems analyst  ?Tobacco Use  ? Smoking status: Never  ? Smokeless tobacco: Never  ?Vaping Use  ? Vaping Use: Never used  ?Substance and Sexual Activity  ? Alcohol use: Yes  ?  Alcohol/week: 0.0 standard drinks  ?  Comment: very rare  ? Drug use: No  ? Sexual activity: Not on file  ?Other Topics Concern  ? Not on file  ?Social History Narrative  ? Not on  file  ? ?Social Determinants of Health  ? ?Financial Resource Strain: Not on file  ?Food Insecurity: Not on file  ?Transportation Needs: Not on file  ?Physical Activity: Not on file  ?Stress: Not on file  ?Social Connections: Not on file  ?Intimate Partner Violence: Not on file  ? ? ? ?PHYSICAL EXAM ? ?Vitals:  ? 06/03/21 1254  ?BP: 136/81  ?Pulse: 60  ?Weight: 241 lb (109.3 kg)  ?Height: 6' (1.829 m)  ? ? ?Body mass index is 32.69 kg/m?. ? ?Visual acuity was 20/20 OD and 20/50 OS ? ?General: The patient is well-developed and well-nourished and in no acute distress ? ?HEENT:  Head is [place]/AT.  Sclera are anicteric.  ? ?Skin: Extremities are without rash or  edema. ? ?Neurologic Exam ? ?Mental status: The patient is alert and oriented x 3 at the time of the examination. The patient has apparent normal recent and remote memory, with an apparently normal attention span and concentration ability.   Speech is normal. ? ?Cranial nerves: Extraocular movements are full.  He has a 1+ left APD.  Patient strength and sensation was normal.  No obvious hearing deficits are noted. ? ?Motor:  Muscle bulk is normal.   Tone is normal. Strength is  5 / 5 in all 4 extremities.  ? ?Sensory: Sensory testing is intact to soft touch and vibration sensation in all 4 extremities. ? ?Coordination: Cerebellar testing reveals good finger-nose-finger and heel-to-shin bilaterally. ? ?Gait and station: Station is normal.   Gait is normal.  The tandem gait is mildly wide.  The Romberg is negative. ? ?Reflexes: Deep tendon reflexes are symmetric and normal  bilaterally.    ? ? ? ? ?DIAGNOSTIC DATA (LABS, IMAGING, TESTING) ?- I reviewed patient records, labs, notes, testing and imaging myself where available. ? ?Lab Results  ?Component Value Date  ? WBC 3.7 11/11/2020  ?

## 2021-06-04 ENCOUNTER — Other Ambulatory Visit: Payer: Self-pay | Admitting: *Deleted

## 2021-06-04 DIAGNOSIS — G35 Multiple sclerosis: Secondary | ICD-10-CM

## 2021-06-04 LAB — CBC WITH DIFFERENTIAL/PLATELET
Basophils Absolute: 0 10*3/uL (ref 0.0–0.2)
Basos: 1 %
EOS (ABSOLUTE): 0 10*3/uL (ref 0.0–0.4)
Eos: 1 %
Hematocrit: 50.6 % (ref 37.5–51.0)
Hemoglobin: 16.8 g/dL (ref 13.0–17.7)
Immature Grans (Abs): 0 10*3/uL (ref 0.0–0.1)
Immature Granulocytes: 1 %
Lymphocytes Absolute: 0.3 10*3/uL — ABNORMAL LOW (ref 0.7–3.1)
Lymphs: 8 %
MCH: 29.1 pg (ref 26.6–33.0)
MCHC: 33.2 g/dL (ref 31.5–35.7)
MCV: 88 fL (ref 79–97)
Monocytes Absolute: 0.4 10*3/uL (ref 0.1–0.9)
Monocytes: 9 %
Neutrophils Absolute: 3.3 10*3/uL (ref 1.4–7.0)
Neutrophils: 80 %
Platelets: 211 10*3/uL (ref 150–450)
RBC: 5.77 x10E6/uL (ref 4.14–5.80)
RDW: 13.5 % (ref 11.6–15.4)
WBC: 4.1 10*3/uL (ref 3.4–10.8)

## 2021-06-04 LAB — HEPATIC FUNCTION PANEL
ALT: 36 IU/L (ref 0–44)
AST: 18 IU/L (ref 0–40)
Albumin: 4.5 g/dL (ref 4.0–5.0)
Alkaline Phosphatase: 52 IU/L (ref 44–121)
Bilirubin Total: 0.5 mg/dL (ref 0.0–1.2)
Bilirubin, Direct: 0.16 mg/dL (ref 0.00–0.40)
Total Protein: 6.4 g/dL (ref 6.0–8.5)

## 2021-06-04 MED ORDER — ZEPOSIA 0.92 MG PO CAPS: 0.9200 mg | ORAL_CAPSULE | Freq: Every day | ORAL | 3 refills | Status: DC

## 2021-09-25 ENCOUNTER — Ambulatory Visit: Payer: No Typology Code available for payment source | Admitting: Neurology

## 2021-12-08 ENCOUNTER — Telehealth: Payer: Self-pay | Admitting: *Deleted

## 2021-12-08 NOTE — Telephone Encounter (Signed)
Austin Mclean Key: [id number] Approved, Coverage Start Date:11/08/2021;Coverage End Date:12/08/2022

## 2021-12-12 ENCOUNTER — Ambulatory Visit
Admission: RE | Admit: 2021-12-12 | Discharge: 2021-12-12 | Disposition: A | Discharge status: Home / Self Care | Payer: No Typology Code available for payment source | Source: Ambulatory Visit | Attending: Neurology | Admitting: Neurology

## 2021-12-12 DIAGNOSIS — G35 Multiple sclerosis: Secondary | ICD-10-CM | POA: Diagnosis not present

## 2021-12-12 MED ORDER — GADOPICLENOL 0.5 MMOL/ML IV SOLN
10.0000 mL | Freq: Once | INTRAVENOUS | Status: AC | PRN
  Administered 2021-12-12: 10 mL via INTRAVENOUS

## 2021-12-24 ENCOUNTER — Encounter: Payer: Self-pay | Admitting: Neurology

## 2021-12-24 ENCOUNTER — Ambulatory Visit (INDEPENDENT_AMBULATORY_CARE_PROVIDER_SITE_OTHER): Payer: No Typology Code available for payment source | Admitting: Neurology

## 2021-12-24 VITALS — BP 145/91 | HR 61 | Ht 72.0 in | Wt 255.0 lb

## 2021-12-24 DIAGNOSIS — R3911 Hesitancy of micturition: Secondary | ICD-10-CM

## 2021-12-24 DIAGNOSIS — Z79899 Other long term (current) drug therapy: Secondary | ICD-10-CM | POA: Diagnosis not present

## 2021-12-24 DIAGNOSIS — R269 Unspecified abnormalities of gait and mobility: Secondary | ICD-10-CM

## 2021-12-24 DIAGNOSIS — Z8669 Personal history of other diseases of the nervous system and sense organs: Secondary | ICD-10-CM

## 2021-12-24 DIAGNOSIS — K5090 Crohn's disease, unspecified, without complications: Secondary | ICD-10-CM

## 2021-12-24 DIAGNOSIS — G35 Multiple sclerosis: Secondary | ICD-10-CM | POA: Diagnosis not present

## 2021-12-24 NOTE — Progress Notes (Signed)
[facility]  PATIENT: UNKNOWN SCHLEYER DOB: 1971/01/19  REFERRING DOCTOR OR PCP: [name], MD SOURCE: Patient, notes from primary care  _________________________________   HISTORICAL  CHIEF COMPLAINT:  Chief Complaint  Patient presents with   Follow-up    RM 1, alone. Last seen 06/03/21. MS DMT: [name].  Doing well, no concerns.     HISTORY OF PRESENT ILLNESS:  Austin Mclean is a 51 year old man with MS diagnosed in 04/2020.  He also has Crohn's disease.    Update  06/03/2021: He is doing well .  He is on Zeposia since April 2022 and has not had any exacerbations.   He tolerates it well.  Because his Crohn's disease has pretty much been in remission he does not know if there has been any benefit there.  He still has some balance issues but this is stable.  Although he was diagnosed with MS in 2012, [name] is the first medication that he has been on.  It was started because the March 2022 brain MRI showed four new lesions on the current study that were not present on the previous study from 2012.  The MRI of the cervical spine did not show any MS plaque.  The MRI of the thoracic spine showed 3 plaques consistent with MS, none present on the 2012 study.    The combination of optic neuritis, characteristics of the MRI lesions in the supratentorial, infratentorial white matter and spinal cord as well as the change over time is consistent with clinically definite multiple sclerosis.  Gait is doing well and he walks 5 to 6 miles a day. Balance is good.  He holds the bannister for safety but does not need to going downstairs.  He denies any difficulties with strength.  Sensation is usually good though he sometimes has arm numbness.  He has urinary hesitancy but no straining.  He feels he sometimes does not empty.   He is nearsighted and needs readers more than last year.     He notes some difficulty with ADD.      He is in Cytogeneticist)  MS history: He had left  optic neuritis in 2012.  An MRI of the brain was abnormal but MRI of the spinal cord was normal.  He was told that he might have MS.  He felt the vision improved a little bit but not to baseline and he continues to have reduced vision out of that eye.  He never followed up for additional MRIs.  Since then, he denies any definite exacerbations but has had some neurologic symptoms at times.  MRI March 2022 showed 4 new lesions not present in 2012.  He started [facility] April 2022.  Other: He is on Xarelto for a bilateral PE 7 years ago.  He noted a possible blood clot in his leg that occurred after a long flight to [facility].   He is fairly active.   He used to be overweight but now exercises.     Imaging: MRI of the brain 05/04/2020 showed multiple T2/FLAIR hyperintense foci in the hemispheres, right thalamus, and one focus each in the cerebellum and pons.  4 of the foci in the hemispheres were not present in 2012.  MRI of the cervical spine 05/16/2020 shows a normal spinal cord and multilevel degenerative changes with mild spinal stenosis at C4-C5 through C6-C7.  The degenerative changes have progressed some compared to the 12/26/2010 MRI.  MRI  of the thoracic spine 05/16/2020 showed foci within the spinal cord adjacent to T4-T5 anteriorly, centrally adjacent to T6-T7 and posterolaterally to the left and centrally adjacent to T10.  None of these foci were present on the MRI of the thoracic spine from 12/26/2010.  REVIEW OF SYSTEMS: Constitutional: No fevers, chills, sweats, or change in appetite Eyes: Persistent left visual changes.  No double vision, eye pain Ear, nose and throat: No hearing loss, ear pain, nasal congestion, sore throat Cardiovascular: No chest pain, palpitations Respiratory:  No shortness of breath at rest or with exertion.   No wheezes GastrointestinaI: No nausea, vomiting, diarrhea, abdominal pain, fecal incontinence Genitourinary:  No dysuria,  urinary retention or frequency.  No nocturia. Musculoskeletal:  No neck pain, back pain Integumentary: No rash, pruritus, skin lesions Neurological: as above Psychiatric: No depression at this time.  No anxiety Endocrine: No palpitations, diaphoresis, change in appetite, change in weigh or increased thirst Hematologic/Lymphatic:  No anemia, purpura, petechiae.  He is on Eliquis for pulmonary embolism Allergic/Immunologic: No itchy/runny eyes, nasal congestion, recent allergic reactions, rashes  ALLERGIES: No Known Allergies  HOME MEDICATIONS:  Current Outpatient Medications:    buPROPion (ZYBAN) 150 MG 12 hr tablet, Take 150 mg by mouth once., Disp: , Rfl:    Cholecalciferol (VITAMIN D3) 50 MCG (2000 UT) TABS, 2 tablets, Disp: , Rfl:    Cyanocobalamin (VITAMIN B12) 1000 MCG TBCR, 1 tablet, Disp: , Rfl:    Ozanimod HCl (ZEPOSIA) 0.92 MG CAPS, Take 1 capsule (0.92 mg total) by mouth daily., Disp: 90 capsule, Rfl: 3   rivaroxaban (XARELTO) 10 MG TABS tablet, Take 10 mg by mouth daily., Disp: , Rfl:    vitamin C (ASCORBIC ACID) 250 MG tablet, 1 tablet, Disp: , Rfl:    Zinc 50 MG TABS, 1 tablet, Disp: , Rfl:   PAST MEDICAL HISTORY: Past Medical History:  Diagnosis Date   Allergy    Anxiety    Colon polyps    pseudopolyps   Crohn disease ([place])    inactive x 10 years   Optic neuritis    Pulmonary embolism ([place]) 2010   bilateral   Sleep apnea     PAST SURGICAL HISTORY: Past Surgical History:  Procedure Laterality Date   COLONOSCOPY     POLYPECTOMY      FAMILY HISTORY: Family History  Problem Relation Age of Onset   Other Father        MVA   Diabetes type I Cousin    Cancer Other        grandparent?   Colon cancer Neg Hx    Colon polyps Neg Hx    Rectal cancer Neg Hx    Stomach cancer Neg Hx     SOCIAL HISTORY:  Social History   Socioeconomic History   Marital status: Divorced    Spouse name: Not on file   Number of children: 2   Years of education: Not on  file   Highest education level: Not on file  Occupational History   Occupation: Systems analyst  Tobacco Use   Smoking status: Never   Smokeless tobacco: Never  Vaping Use   Vaping Use: Never used  Substance and Sexual Activity   Alcohol use: Yes    Alcohol/week: 0.0 standard drinks of alcohol    Comment: very rare   Drug use: No   Sexual activity: Not on file  Other Topics Concern   Not on file  Social History Narrative   Not on file  Social Determinants of Health   Financial Resource Strain: Not on file  Food Insecurity: Not on file  Transportation Needs: Not on file  Physical Activity: Not on file  Stress: Not on file  Social Connections: Not on file  Intimate Partner Violence: Not on file     PHYSICAL EXAM  Vitals:   12/24/21 1555  BP: (!) 145/91  Pulse: 61  Weight: 255 lb (115.7 kg)  Height: 6' (1.829 m)    Body mass index is 34.58 kg/m.  Visual acuity was 20/20 OD and 20/50 OS  General: The patient is well-developed and well-nourished and in no acute distress  HEENT:  Head is [place]/AT.  Sclera are anicteric.   Skin: Extremities are without rash or  edema.  Neurologic Exam  Mental status: The patient is alert and oriented x 3 at the time of the examination. The patient has apparent normal recent and remote memory, with an apparently normal attention span and concentration ability.   Speech is normal.  Cranial nerves: Extraocular movements are full.  He has a 1+ left APD.  Patient strength and sensation was normal.  No obvious hearing deficits are noted.  Motor:  Muscle bulk is normal.   Tone is normal. Strength is  5 / 5 in all 4 extremities.   Sensory: Sensory testing is intact to soft touch and vibration sensation in all 4 extremities.  Coordination: Cerebellar testing reveals good finger-nose-finger and heel-to-shin bilaterally.  Gait and station: Station is normal.   Gait is normal.  The tandem gait is mildly wide.  Romberg is  negative..  Reflexes: Deep tendon reflexes are symmetric and normal bilaterally.        DIAGNOSTIC DATA (LABS, IMAGING, TESTING) - I reviewed patient records, labs, notes, testing and imaging myself where available.  Lab Results  Component Value Date   WBC 4.1 06/03/2021   HGB 16.8 06/03/2021   HCT 50.6 06/03/2021   MCV 88 06/03/2021   PLT 211 06/03/2021      Component Value Date/Time   NA 140 06/17/2010 1330   K 4.1 06/17/2010 1330   CL 105 06/17/2010 1330   CO2 28 06/17/2010 1330   GLUCOSE 103 (H) 06/17/2010 1330   BUN 8 06/17/2010 1330   CREATININE 1.20 06/17/2010 1330   CALCIUM 9.3 06/17/2010 1330   PROT 6.4 06/03/2021 1354   ALBUMIN 4.5 06/03/2021 1354   AST 18 06/03/2021 1354   ALT 36 06/03/2021 1354   ALKPHOS 52 06/03/2021 1354   BILITOT 0.5 06/03/2021 1354   GFRNONAA >60 06/17/2010 1330   GFRAA  06/17/2010 1330    >60        The eGFR has been calculated using the MDRD equation. This calculation has not been validated in all clinical situations. eGFR's persistently <60 mL/min signify possible Chronic Kidney Disease.       ASSESSMENT AND PLAN  Multiple sclerosis (HCC)  High risk medication use  Gait disturbance  Urinary hesitancy  History of optic neuritis  Crohn's disease without complication, unspecified gastrointestinal tract location ([place])  1.   He will continue Zeposia for MS disease modifying therapy.  This may also help Crohn's disease.  We will check labs.  We will check MRI of the brain to determine if there is any breakthrough activity.  If present we may need to consider different disease modifying therapy. 2.    Stay active.  We discussed good diet and taking vitamin D 3.   Return in 3 months or sooner  if there are new or worsening neurologic symptoms.    Jadda Hunsucker A. Felecia Shelling, MD, [facility] 75/88/3254, 9:82 PM Certified in Neurology, Clinical Neurophysiology, Sleep Medicine and Neuroimaging  [facility] [address] [place], [place] [address] [phone]

## 2022-05-04 ENCOUNTER — Other Ambulatory Visit: Payer: Self-pay | Admitting: Neurology

## 2022-05-04 DIAGNOSIS — G35 Multiple sclerosis: Secondary | ICD-10-CM

## 2022-05-04 NOTE — Telephone Encounter (Signed)
Follow up visit scheduled on 06/03/22

## 2022-06-03 ENCOUNTER — Encounter: Payer: Self-pay | Admitting: Neurology

## 2022-06-03 ENCOUNTER — Ambulatory Visit (INDEPENDENT_AMBULATORY_CARE_PROVIDER_SITE_OTHER): Payer: No Typology Code available for payment source | Admitting: Neurology

## 2022-06-03 VITALS — BP 146/89 | HR 70 | Ht 72.0 in | Wt 275.0 lb

## 2022-06-03 DIAGNOSIS — R269 Unspecified abnormalities of gait and mobility: Secondary | ICD-10-CM | POA: Diagnosis not present

## 2022-06-03 DIAGNOSIS — G35 Multiple sclerosis: Secondary | ICD-10-CM

## 2022-06-03 DIAGNOSIS — G35D Multiple sclerosis, unspecified: Secondary | ICD-10-CM

## 2022-06-03 DIAGNOSIS — Z79899 Other long term (current) drug therapy: Secondary | ICD-10-CM

## 2022-06-03 DIAGNOSIS — R3911 Hesitancy of micturition: Secondary | ICD-10-CM

## 2022-06-03 NOTE — Addendum Note (Signed)
Addended by: Britt Bottom on: 06/03/2022 06:34 PM   Modules accepted: Orders

## 2022-06-03 NOTE — Progress Notes (Signed)
[facility]  PATIENT: Austin Mclean DOB: 1970-10-28  REFERRING DOCTOR OR PCP: [name], MD SOURCE: Patient, notes from primary care  _________________________________   HISTORICAL  CHIEF COMPLAINT:  Chief Complaint  Patient presents with   Room 11    Pt is here with his Wife. Pt states no new changes. Pt states no new symptoms to report.     HISTORY OF PRESENT ILLNESS:  Austin Mclean is a 52 year old man with MS diagnosed in 04/2020.  He also has Crohn's disease.    Update  06/03/2022: He is doing well .  He is on Zeposia since April 2022 and has not had any exacerbations.  MRI 12/12/2021 did not show any new lesions.  He tolerates it well.  Because his Crohn's disease has pretty much been in remission he does not know if there has been any benefit there.   Although he was diagnosed with MS in 2012, [name] is the first medication that he has been on.  It was started because the March 2022 brain MRI showed four new lesions on the current study that were not present on the previous study from 2012.  The MRI of the cervical spine did not show any MS plaque.  The MRI of the thoracic spine showed 3 plaques consistent with MS, none present on the 2012 study.    The combination of optic neuritis, characteristics of the MRI lesions in the supratentorial, infratentorial white matter and spinal cord as well as the change over time is consistent with clinically definite multiple sclerosis.  Today, he wished to discuss in more detail current issues, prognosis and possible future treatments.  His long-term girlfriend is with him.  We went over these issues.  I showed the MRI images.  Clinically continues to do well.  There are mild balance issues that are stable.  We discussed that these are likely due to the 3 foci in the thoracic spinal cord.    Gait is doing well and he walks several miles a day.   He holds the bannister for safety but does not need to going  downstairs.  He denies any difficulties with strength.  Sensation is usually good though he sometimes has arm numbness.  He has urinary hesitancy but no straining.  He feels he sometimes does not empty.   He is nearsighted and needs readers more than last year.     He notes some difficulty with ADD.   He is in Research scientist (life sciences))  MS history: He had left  optic neuritis in 2012.  An MRI of the brain was abnormal but MRI of the spinal cord was normal.  He was told that he might have MS.  He felt the vision improved a little bit but not to baseline and he continues to have reduced vision out of that eye.  He never followed up for additional MRIs.  Since then, he denies any definite exacerbations but has had some neurologic symptoms at times.  MRI March 2022 showed 4 new lesions not present in 2012.  He started [facility] April 2022.  Other: He is on Xarelto for a bilateral PE 7 years ago.  He noted a possible blood clot in his leg that occurred after a long flight to [facility].   He is fairly active.   He used to be overweight but now exercises.     Imaging: MRI of the brain 05/04/2020 showed multiple T2/FLAIR hyperintense foci  in the hemispheres, right thalamus, and one focus each in the cerebellum and pons.  4 of the foci in the hemispheres were not present in 2012.  MRI of the cervical spine 05/16/2020 shows a normal spinal cord and multilevel degenerative changes with mild spinal stenosis at C4-C5 through C6-C7.  The degenerative changes have progressed some compared to the 12/26/2010 MRI.  MRI of the thoracic spine 05/16/2020 showed foci within the spinal cord adjacent to T4-T5 anteriorly, centrally adjacent to T6-T7 and posterolaterally to the left and centrally adjacent to T10.  None of these foci were present on the MRI of the thoracic spine from 12/26/2010.  MRI of the brain 12/12/2021 showed no new lesions.  REVIEW OF SYSTEMS: Constitutional: No fevers, chills,  sweats, or change in appetite Eyes: Persistent left visual changes.  No double vision, eye pain Ear, nose and throat: No hearing loss, ear pain, nasal congestion, sore throat Cardiovascular: No chest pain, palpitations Respiratory:  No shortness of breath at rest or with exertion.   No wheezes GastrointestinaI: No nausea, vomiting, diarrhea, abdominal pain, fecal incontinence Genitourinary:  No dysuria, urinary retention or frequency.  No nocturia. Musculoskeletal:  No neck pain, back pain Integumentary: No rash, pruritus, skin lesions Neurological: as above Psychiatric: No depression at this time.  No anxiety Endocrine: No palpitations, diaphoresis, change in appetite, change in weigh or increased thirst Hematologic/Lymphatic:  No anemia, purpura, petechiae.  He is on Eliquis for pulmonary embolism Allergic/Immunologic: No itchy/runny eyes, nasal congestion, recent allergic reactions, rashes  ALLERGIES: No Known Allergies  HOME MEDICATIONS:  Current Outpatient Medications:    buPROPion (ZYBAN) 150 MG 12 hr tablet, Take 150 mg by mouth once., Disp: , Rfl:    Cholecalciferol (VITAMIN D3) 50 MCG (2000 UT) TABS, 2 tablets, Disp: , Rfl:    Cyanocobalamin (VITAMIN B12) 1000 MCG TBCR, 1 tablet, Disp: , Rfl:    Ozanimod HCl (ZEPOSIA) 0.92 MG CAPS, TAKE 1 CAPSULE DAILY, Disp: 90 capsule, Rfl: 0   rivaroxaban (XARELTO) 10 MG TABS tablet, Take 10 mg by mouth daily., Disp: , Rfl:    vitamin C (ASCORBIC ACID) 250 MG tablet, 1 tablet, Disp: , Rfl:    Zinc 50 MG TABS, 1 tablet, Disp: , Rfl:   PAST MEDICAL HISTORY: Past Medical History:  Diagnosis Date   Allergy    Anxiety    Colon polyps    pseudopolyps   Crohn disease    inactive x 10 years   Optic neuritis    Pulmonary embolism 2010   bilateral   Sleep apnea     PAST SURGICAL HISTORY: Past Surgical History:  Procedure Laterality Date   COLONOSCOPY     POLYPECTOMY      FAMILY HISTORY: Family History  Problem Relation Age of  Onset   Other Father        MVA   Diabetes type I Cousin    Cancer Other        grandparent?   Colon cancer Neg Hx    Colon polyps Neg Hx    Rectal cancer Neg Hx    Stomach cancer Neg Hx     SOCIAL HISTORY:  Social History   Socioeconomic History   Marital status: Divorced    Spouse name: Not on file   Number of children: 2   Years of education: Not on file   Highest education level: Not on file  Occupational History   Occupation: Systems analyst  Tobacco Use   Smoking status: Never  Smokeless tobacco: Never  Vaping Use   Vaping Use: Never used  Substance and Sexual Activity   Alcohol use: Yes    Alcohol/week: 0.0 standard drinks of alcohol    Comment: very rare   Drug use: No   Sexual activity: Not on file  Other Topics Concern   Not on file  Social History Narrative   Right handed    Social Determinants of Health   Financial Resource Strain: Not on file  Food Insecurity: Not on file  Transportation Needs: Not on file  Physical Activity: Not on file  Stress: Not on file  Social Connections: Not on file  Intimate Partner Violence: Not on file     PHYSICAL EXAM  Vitals:   06/03/22 1611  BP: (!) 146/89  Pulse: 70  Weight: 275 lb (124.7 kg)  Height: 6' (1.829 m)    Body mass index is 37.3 kg/m.  Visual acuity was 20/20 OD and 20/50 OS  General: The patient is well-developed and well-nourished and in no acute distress  HEENT:  Head is [place]/AT.  Sclera are anicteric.   Skin: Extremities are without rash or  edema.  Neurologic Exam  Mental status: The patient is alert and oriented x 3 at the time of the examination. The patient has apparent normal recent and remote memory, with an apparently normal attention span and concentration ability.   Speech is normal.  Cranial nerves: Extraocular movements are full.  He has a 1+ left APD.  Patient strength and sensation was normal.  No obvious hearing deficits are noted.  Motor:  Muscle bulk is normal.    Tone is normal. Strength is  5 / 5 in all 4 extremities.   Sensory: Sensory testing is intact to soft touch and vibration sensation in all 4 extremities.  Coordination: Cerebellar testing reveals good finger-nose-finger and heel-to-shin bilaterally.  Gait and station: Station is normal.   Gait is normal.  The tandem gait is mildly wide..  Romberg is negative..  Reflexes: Deep tendon reflexes are symmetric and normal bilaterally.        DIAGNOSTIC DATA (LABS, IMAGING, TESTING) - I reviewed patient records, labs, notes, testing and imaging myself where available.  Lab Results  Component Value Date   WBC 4.1 06/03/2021   HGB 16.8 06/03/2021   HCT 50.6 06/03/2021   MCV 88 06/03/2021   PLT 211 06/03/2021      Component Value Date/Time   NA 140 06/17/2010 1330   K 4.1 06/17/2010 1330   CL 105 06/17/2010 1330   CO2 28 06/17/2010 1330   GLUCOSE 103 (H) 06/17/2010 1330   BUN 8 06/17/2010 1330   CREATININE 1.20 06/17/2010 1330   CALCIUM 9.3 06/17/2010 1330   PROT 6.4 06/03/2021 1354   ALBUMIN 4.5 06/03/2021 1354   AST 18 06/03/2021 1354   ALT 36 06/03/2021 1354   ALKPHOS 52 06/03/2021 1354   BILITOT 0.5 06/03/2021 1354   GFRNONAA >60 06/17/2010 1330   GFRAA  06/17/2010 1330    >60        The eGFR has been calculated using the MDRD equation. This calculation has not been validated in all clinical situations. eGFR's persistently <60 mL/min signify possible Chronic Kidney Disease.       ASSESSMENT AND PLAN  Multiple sclerosis - Plan: CBC with Differential/Platelet, Comprehensive metabolic panel  High risk medication use - Plan: CBC with Differential/Platelet, Comprehensive metabolic panel  Gait disturbance  Urinary hesitancy  1.   Continue Zeposia for MS  disease modifying therapy.  This may also help Crohn's disease.  We will check lab work.    We did discuss other medications including Ocrevus and drugs in clinical trials (subcutaneous Ocrevus and the BTK  inhibitors) 2.    Stay active.  We discussed good diet, trying to keep in shape and taking vitamin D 3.   Return in 6 months or sooner if there are new or worsening neurologic symptoms.  44-minute office visit with the majority of the time spent face-to-face for history and physical, discussion/counseling and decision-making.  Additional time with record review and documentation.   Shun Pletz A. Felecia Shelling, MD, [facility] [id number], [id number] PM Certified in Neurology, Clinical Neurophysiology, Sleep Medicine and Neuroimaging  [facility] [address] [place], [place] [address] [phone]

## 2022-06-25 ENCOUNTER — Ambulatory Visit: Payer: No Typology Code available for payment source | Admitting: Neurology

## 2022-07-02 ENCOUNTER — Other Ambulatory Visit (INDEPENDENT_AMBULATORY_CARE_PROVIDER_SITE_OTHER): Payer: Self-pay

## 2022-07-02 DIAGNOSIS — Z79899 Other long term (current) drug therapy: Secondary | ICD-10-CM

## 2022-07-02 DIAGNOSIS — G35 Multiple sclerosis: Secondary | ICD-10-CM

## 2022-07-02 DIAGNOSIS — Z0289 Encounter for other administrative examinations: Secondary | ICD-10-CM

## 2022-07-03 LAB — COMPREHENSIVE METABOLIC PANEL
ALT: 31 IU/L (ref 0–44)
AST: 20 IU/L (ref 0–40)
Albumin/Globulin Ratio: 2 (ref 1.2–2.2)
Albumin: 4.3 g/dL (ref 4.1–5.1)
Alkaline Phosphatase: 47 IU/L (ref 44–121)
BUN/Creatinine Ratio: 10 (ref 9–20)
BUN: 12 mg/dL (ref 6–24)
Bilirubin Total: 0.6 mg/dL (ref 0.0–1.2)
CO2: 26 mmol/L (ref 20–29)
Calcium: 9.6 mg/dL (ref 8.7–10.2)
Chloride: 104 mmol/L (ref 96–106)
Creatinine, Ser: 1.2 mg/dL (ref 0.76–1.27)
Globulin, Total: 2.1 g/dL (ref 1.5–4.5)
Glucose: 100 mg/dL — ABNORMAL HIGH (ref 70–99)
Potassium: 4.1 mmol/L (ref 3.5–5.2)
Sodium: 144 mmol/L (ref 134–144)
Total Protein: 6.4 g/dL (ref 6.0–8.5)
eGFR: 74 mL/min/{1.73_m2} (ref >59)

## 2022-07-03 LAB — CBC WITH DIFFERENTIAL/PLATELET
Basophils Absolute: 0 10*3/uL (ref 0.0–0.2)
Basos: 1 %
EOS (ABSOLUTE): 0 10*3/uL (ref 0.0–0.4)
Eos: 1 %
Hematocrit: 52 % — ABNORMAL HIGH (ref 37.5–51.0)
Hemoglobin: 16.7 g/dL (ref 13.0–17.7)
Immature Grans (Abs): 0 10*3/uL (ref 0.0–0.1)
Immature Granulocytes: 1 %
Lymphocytes Absolute: 0.3 10*3/uL — ABNORMAL LOW (ref 0.7–3.1)
Lymphs: 8 %
MCH: 28.4 pg (ref 26.6–33.0)
MCHC: 32.1 g/dL (ref 31.5–35.7)
MCV: 89 fL (ref 79–97)
Monocytes Absolute: 0.3 10*3/uL (ref 0.1–0.9)
Monocytes: 10 %
Neutrophils Absolute: 2.6 10*3/uL (ref 1.4–7.0)
Neutrophils: 79 %
Platelets: 165 10*3/uL (ref 150–450)
RBC: 5.87 x10E6/uL — ABNORMAL HIGH (ref 4.14–5.80)
RDW: 13.4 % (ref 11.6–15.4)
WBC: 3.3 10*3/uL — ABNORMAL LOW (ref 3.4–10.8)

## 2022-08-14 ENCOUNTER — Other Ambulatory Visit: Payer: Self-pay | Admitting: Neurology

## 2022-08-14 DIAGNOSIS — G35 Multiple sclerosis: Secondary | ICD-10-CM

## 2022-08-17 NOTE — Telephone Encounter (Signed)
Last seen on 06/03/22 per note "Continue Zeposia for MS disease modifying therapy.  " Follow up scheduled on 12/10/22

## 2022-11-25 ENCOUNTER — Telehealth: Payer: Self-pay

## 2022-11-25 ENCOUNTER — Telehealth: Payer: Self-pay | Admitting: Neurology

## 2022-11-25 ENCOUNTER — Other Ambulatory Visit (HOSPITAL_COMMUNITY): Payer: Self-pay

## 2022-11-25 NOTE — Telephone Encounter (Signed)
Pharmacy Patient Advocate Encounter  Received notification from [facility] that Prior Authorization for Zeposia 0.92MG  capsules has been APPROVED from 10/26/2022 to 11/25/2023   PA #/Case ID/Reference #: [id number]  Key: [id number]

## 2022-11-25 NOTE — Telephone Encounter (Signed)
Thank you so much for that information!   PA request has been Approved. New Encounter created for follow up. For additional info see Pharmacy Prior Auth telephone encounter from 11/25/2022.

## 2022-11-25 NOTE — Telephone Encounter (Signed)
I just checked the fax machine for the past week and have not received anything yet for this PT-Not sure what fax they sent it to. My fax is [phone].

## 2022-11-25 NOTE — Telephone Encounter (Signed)
Austin Mclean, I called Austin Mclean and spoke w/ Austin Mclean. She will re-fax form to your fax number. She also provided me key: [id number]

## 2022-11-25 NOTE — Telephone Encounter (Signed)
Phone room: please call pt and let him know PA approved for Zeposia. Thank you

## 2022-11-25 NOTE — Telephone Encounter (Signed)
Austin Mclean from [facility] called stating that the pt's PA will expire on Oct 8th of this year and is wanting to know if the papers that were sent were received and she is offering help with any questions.

## 2022-11-25 NOTE — Telephone Encounter (Addendum)
Can you please help with this PA request? We have not received any papers.

## 2022-11-26 NOTE — Telephone Encounter (Signed)
Noted  

## 2022-12-04 ENCOUNTER — Other Ambulatory Visit: Payer: Self-pay | Admitting: Neurology

## 2022-12-04 DIAGNOSIS — G35 Multiple sclerosis: Secondary | ICD-10-CM

## 2022-12-07 NOTE — Telephone Encounter (Signed)
Last seen on 06/03/22 Follow up scheduled 12/10/22

## 2022-12-10 ENCOUNTER — Ambulatory Visit (INDEPENDENT_AMBULATORY_CARE_PROVIDER_SITE_OTHER): Payer: No Typology Code available for payment source | Admitting: Neurology

## 2022-12-10 ENCOUNTER — Encounter: Payer: Self-pay | Admitting: Neurology

## 2022-12-10 VITALS — BP 129/82 | HR 76 | Ht 72.0 in | Wt 278.5 lb

## 2022-12-10 DIAGNOSIS — G35 Multiple sclerosis: Secondary | ICD-10-CM | POA: Diagnosis not present

## 2022-12-10 DIAGNOSIS — Z79899 Other long term (current) drug therapy: Secondary | ICD-10-CM | POA: Diagnosis not present

## 2022-12-10 DIAGNOSIS — G35D Multiple sclerosis, unspecified: Secondary | ICD-10-CM

## 2022-12-10 DIAGNOSIS — K5090 Crohn's disease, unspecified, without complications: Secondary | ICD-10-CM

## 2022-12-10 DIAGNOSIS — R3911 Hesitancy of micturition: Secondary | ICD-10-CM

## 2022-12-10 DIAGNOSIS — Z8669 Personal history of other diseases of the nervous system and sense organs: Secondary | ICD-10-CM | POA: Diagnosis not present

## 2022-12-10 NOTE — Progress Notes (Signed)
[facility]  PATIENT: Austin Mclean DOB: 1970/06/10  REFERRING DOCTOR OR PCP: Austin Floro, MD SOURCE: Patient, notes from primary care  _________________________________   HISTORICAL  CHIEF COMPLAINT:  Chief Complaint  Patient presents with   Follow-up    Pt in room 11. Here for MS follow up on Zeposia.  Pt states how stress with life changes. Discuss thoughts on other medications for MS.    HISTORY OF PRESENT ILLNESS:  Austin Mclean is a 52 year old man with MS diagnosed in 04/2020.  He also has Crohn's disease.    Update  12/10/2022: His MS is doing well.  He is on Zeposia since April 2022 and has not had any exacerbations.  He had wanted to discuss Kesimpta and we went over that and Ocrevus as possible options instead of the Zeposia.  MRI 12/12/2021 did not show any new lesions.  He tolerates it well.   Although he was diagnosed with MS in 2012, Austin Mclean is the first medication that he has been on.  It was started because the March 2022 brain MRI showed four new lesions on the current study that were not present on the previous study from 2012.  The MRI of the cervical spine did not show any MS plaque.  The MRI of the thoracic spine showed 3 plaques consistent with MS, none present on the 2012 study.    The combination of optic neuritis, characteristics of the MRI lesions in the supratentorial, infratentorial white matter and spinal cord as well as the change over time is consistent with clinically definite multiple sclerosis.  Clinically continues to do well.  There are mild balance issues that are stable.  We discussed that these are likely due to the 3 foci in the thoracic spinal cord.    Gait is doing well and he walks several miles a day.   He holds the bannister for safety but does not need to going downstairs.  He denies any difficulties with strength.  Sensation is usually good though he sometimes has arm numbness.  He has urinary hesitancy but no  straining.  He feels he sometimes does not empty.   He is nearsighted and needs readers more than last year.     He has some word finding issues at times butno other cognitive issues.    He has Crohn's disease but has not been on a treatment x 25 years.    He notes some difficulty with ADD.   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  An MRI of the brain was abnormal but MRI of the spinal cord was normal.  He was told that he might have MS.  He felt the vision improved a little bit but not to baseline and he continues to have reduced vision out of that eye.  He never followed up for additional MRIs.  Since then, he denies any definite exacerbations but has had some neurologic symptoms at times.  MRI March 2022 showed 4 new lesions not present in 2012.  He started [facility] April 2022.  Other: He is on Xarelto for a bilateral PE 7 years ago.  He noted a possible blood clot in his leg that occurred after a long flight to [facility].   He is fairly active.   He used to be overweight but now exercises.  Imaging: MRI of the brain 05/04/2020 showed multiple T2/FLAIR hyperintense foci in the hemispheres, right thalamus, and one focus each in the cerebellum and pons.  4 of the foci in the hemispheres were not present in 2012.  MRI of the cervical spine 05/16/2020 shows a normal spinal cord and multilevel degenerative changes with mild spinal stenosis at C4-C5 through C6-C7.  The degenerative changes have progressed some compared to the 12/26/2010 MRI.  MRI of the thoracic spine 05/16/2020 showed foci within the spinal cord adjacent to T4-T5 anteriorly, centrally adjacent to T6-T7 and posterolaterally to the left and centrally adjacent to T10.  None of these foci were present on the MRI of the thoracic spine from 12/26/2010.  MRI of the brain 12/12/2021 showed no new  lesions.  REVIEW OF SYSTEMS: Constitutional: No fevers, chills, sweats, or change in appetite Eyes: Persistent left visual changes.  No double vision, eye pain Ear, nose and throat: No hearing loss, ear pain, nasal congestion, sore throat Cardiovascular: No chest pain, palpitations Respiratory:  No shortness of breath at rest or with exertion.   No wheezes GastrointestinaI: No nausea, vomiting, diarrhea, abdominal pain, fecal incontinence Genitourinary:  No dysuria, urinary retention or frequency.  No nocturia. Musculoskeletal:  No neck pain, back pain Integumentary: No rash, pruritus, skin lesions Neurological: as above Psychiatric: No depression at this time.  No anxiety Endocrine: No palpitations, diaphoresis, change in appetite, change in weigh or increased thirst Hematologic/Lymphatic:  No anemia, purpura, petechiae.  He is on Eliquis for pulmonary embolism Allergic/Immunologic: No itchy/runny eyes, nasal congestion, recent allergic reactions, rashes  ALLERGIES: No Known Allergies  HOME MEDICATIONS:  Current Outpatient Medications:    buPROPion (ZYBAN) 150 MG 12 hr tablet, Take 150 mg by mouth once., Disp: , Rfl:    Cholecalciferol (VITAMIN D3) 50 MCG (2000 UT) TABS, 2 tablets, Disp: , Rfl:    Cyanocobalamin (VITAMIN B12) 1000 MCG TBCR, 1 tablet, Disp: , Rfl:    rivaroxaban (XARELTO) 10 MG TABS tablet, Take 10 mg by mouth daily., Disp: , Rfl:    vitamin C (ASCORBIC ACID) 250 MG tablet, 1 tablet, Disp: , Rfl:    ZEPOSIA 0.92 MG CAPS, TAKE 1 CAPSULE DAILY, Disp: 90 capsule, Rfl: 0   Zinc 50 MG TABS, 1 tablet, Disp: , Rfl:   PAST MEDICAL HISTORY: Past Medical History:  Diagnosis Date   Allergy    Anxiety    Colon polyps    pseudopolyps   Crohn disease (HCC)    inactive x 10 years   Optic neuritis    Pulmonary embolism (HCC) 2010   bilateral   Sleep apnea     PAST SURGICAL HISTORY: Past Surgical History:  Procedure Laterality Date   COLONOSCOPY     POLYPECTOMY       FAMILY HISTORY: Family History  Problem Relation Age of Onset   Other Father        MVA   Diabetes type I Cousin    Cancer Other        grandparent?   Colon cancer Neg Hx    Colon polyps Neg Hx    Rectal cancer Neg Hx    Stomach cancer Neg Hx     SOCIAL HISTORY:  Social History   Socioeconomic History   Marital status: Divorced    Spouse name: Not on file   Number of children: 2   Years of education: Not on file   Highest education level: Not on file  Occupational History   Occupation: [employer]  Tobacco Use   Smoking status: Never   Smokeless tobacco: Never  Vaping Use   Vaping status: Never Used  Substance and Sexual Activity   Alcohol use: Yes    Alcohol/week: 0.0 standard drinks of alcohol    Comment: very rare   Drug use: No   Sexual activity: Not on file  Other Topics Concern   Not on file  Social History Narrative   Right handed    Social Determinants of Health   Financial Resource Strain: Not on file  Food Insecurity: Not on file  Transportation Needs: Not on file  Physical Activity: Not on file  Stress: Not on file  Social Connections: Not on file  Intimate Partner Violence: Not on file     PHYSICAL EXAM  Vitals:   12/10/22 1543  BP: 129/82  Pulse: 76  Weight: 278 lb 8 oz (126.3 kg)  Height: 6' (1.829 m)    Body mass index is 37.77 kg/m.  Visual acuity was 20/20 OD and 20/50 OS  General: The patient is well-developed and well-nourished and in no acute distress  HEENT:  Head is [place]/AT.  Sclera are anicteric.   Skin: Extremities are without rash or  edema.  Neurologic Exam  Mental status: The patient is alert and oriented x 3 at the time of the examination. The patient has apparent normal recent and remote memory, with an apparently normal attention span and concentration ability.   Speech is normal.  Cranial nerves: Extraocular movements are full.  He has a 1+ left APD.  Patient strength and sensation was normal.  No  obvious hearing deficits are noted.  Motor:  Muscle bulk is normal.   Tone is normal. Strength is  5 / 5 in all 4 extremities.   Sensory: Sensory testing is intact to soft touch and vibration sensation in all 4 extremities.  Coordination: Cerebellar testing reveals good finger-nose-finger and heel-to-shin bilaterally.  Gait and station: Station is normal.   Gait is normal.  The tandem gait is mildly wide..  Romberg is negative..  Reflexes: Deep tendon reflexes are symmetric and normal bilaterally.        DIAGNOSTIC DATA (LABS, IMAGING, TESTING) - I reviewed patient records, labs, notes, testing and imaging myself where available.  Lab Results  Component Value Date   WBC 3.3 (L) 07/02/2022   HGB 16.7 07/02/2022   HCT 52.0 (H) 07/02/2022   MCV 89 07/02/2022   PLT 165 07/02/2022      Component Value Date/Time   NA 144 07/02/2022 0940   K 4.1 07/02/2022 0940   CL 104 07/02/2022 0940   CO2 26 07/02/2022 0940   GLUCOSE 100 (H) 07/02/2022 0940   GLUCOSE 103 (H) 06/17/2010 1330   BUN 12 07/02/2022 0940   CREATININE 1.20 07/02/2022 0940   CALCIUM 9.6 07/02/2022 0940   PROT 6.4 07/02/2022 0940   ALBUMIN 4.3 07/02/2022 0940   AST 20 07/02/2022 0940   ALT 31 07/02/2022 0940   ALKPHOS 47 07/02/2022 0940   BILITOT 0.6 07/02/2022 0940   GFRNONAA >60 06/17/2010 1330   GFRAA  06/17/2010 1330    >60        The eGFR has been calculated using the MDRD equation. This calculation has not been validated in all clinical situations. eGFR's persistently <60 mL/min signify possible Chronic Kidney Disease.       ASSESSMENT AND PLAN  Multiple sclerosis (HCC) - Plan: CBC with Differential/Platelet  High risk medication use - Plan: CBC  with Differential/Platelet  Urinary hesitancy  History of optic neuritis  Crohn's disease without complication, unspecified gastrointestinal tract location (HCC)  1.   For now, he will continue Zeposia for MS disease modifying therapy.  This may  also help Crohn's disease.  We will CBC with differential.  We did discuss other agents.  He would prefer not to take a daily pill.  She is potentially interested in the anti-CD20 agents.  We discussed Kesimpta and Ocrevus in more detail.   2.    Stay active.  We discussed good diet, trying to keep in shape and taking vitamin D 3.   Return in 6 months or sooner if there are new or worsening neurologic symptoms.   Kinleigh Nault A. Epimenio Foot, MD, [facility] 12/10/2022, 4:56 PM Certified in Neurology, Clinical Neurophysiology, Sleep Medicine and Neuroimaging  [facility] [address] [place], [place] [address] [phone]

## 2022-12-11 LAB — CBC WITH DIFFERENTIAL/PLATELET
Basophils Absolute: 0 10*3/uL (ref 0.0–0.2)
Basos: 1 %
EOS (ABSOLUTE): 0 10*3/uL (ref 0.0–0.4)
Eos: 1 %
Hematocrit: 47.3 % (ref 37.5–51.0)
Hemoglobin: 16.1 g/dL (ref 13.0–17.7)
Immature Grans (Abs): 0 10*3/uL (ref 0.0–0.1)
Immature Granulocytes: 1 %
Lymphocytes Absolute: 0.2 10*3/uL — ABNORMAL LOW (ref 0.7–3.1)
Lymphs: 6 %
MCH: 29.6 pg (ref 26.6–33.0)
MCHC: 34 g/dL (ref 31.5–35.7)
MCV: 87 fL (ref 79–97)
Monocytes Absolute: 0.4 10*3/uL (ref 0.1–0.9)
Monocytes: 10 %
Neutrophils Absolute: 3.6 10*3/uL (ref 1.4–7.0)
Neutrophils: 81 %
Platelets: 187 10*3/uL (ref 150–450)
RBC: 5.44 x10E6/uL (ref 4.14–5.80)
RDW: 13.5 % (ref 11.6–15.4)
WBC: 4.4 10*3/uL (ref 3.4–10.8)

## 2023-03-01 ENCOUNTER — Other Ambulatory Visit: Payer: Self-pay | Admitting: Neurology

## 2023-03-01 DIAGNOSIS — G35 Multiple sclerosis: Secondary | ICD-10-CM

## 2023-03-01 NOTE — Telephone Encounter (Signed)
Last seen on 12/10/22 Follow up scheduled on 06/17/22

## 2023-05-24 ENCOUNTER — Other Ambulatory Visit: Payer: Self-pay | Admitting: Neurology

## 2023-05-24 DIAGNOSIS — G35 Multiple sclerosis: Secondary | ICD-10-CM

## 2023-05-24 NOTE — Telephone Encounter (Signed)
 Last seen on 12/10/22 Follow up scheduled on 06/17/23

## 2023-06-15 ENCOUNTER — Telehealth: Payer: Self-pay | Admitting: Neurology

## 2023-06-15 NOTE — Telephone Encounter (Signed)
 Called pt back. Got pt r/s for 06/28/23 at 2pm with Dr. Godwin Lat. Cx 01/2024 since we got him r/s for sooner appt.

## 2023-06-15 NOTE — Telephone Encounter (Signed)
 Due to conflict with work pt had to r/s his appointment.

## 2023-06-17 ENCOUNTER — Ambulatory Visit: Payer: No Typology Code available for payment source | Admitting: Neurology

## 2023-06-28 ENCOUNTER — Encounter: Payer: Self-pay | Admitting: Neurology

## 2023-06-28 ENCOUNTER — Ambulatory Visit (INDEPENDENT_AMBULATORY_CARE_PROVIDER_SITE_OTHER): Admitting: Neurology

## 2023-06-28 VITALS — BP 134/81 | HR 65 | Ht 72.0 in | Wt 291.0 lb

## 2023-06-28 DIAGNOSIS — Z79899 Other long term (current) drug therapy: Secondary | ICD-10-CM | POA: Diagnosis not present

## 2023-06-28 DIAGNOSIS — R269 Unspecified abnormalities of gait and mobility: Secondary | ICD-10-CM

## 2023-06-28 DIAGNOSIS — Z8669 Personal history of other diseases of the nervous system and sense organs: Secondary | ICD-10-CM | POA: Diagnosis not present

## 2023-06-28 DIAGNOSIS — R3911 Hesitancy of micturition: Secondary | ICD-10-CM | POA: Diagnosis not present

## 2023-06-28 DIAGNOSIS — G35 Multiple sclerosis: Secondary | ICD-10-CM | POA: Diagnosis not present

## 2023-06-28 NOTE — Progress Notes (Signed)
 [facility]  PATIENT: Austin Mclean DOB: May 18, 1970  REFERRING DOCTOR OR PCP: Austin Mould, MD SOURCE: Patient, notes from primary care  _________________________________   HISTORICAL  CHIEF COMPLAINT:  Chief Complaint  Patient presents with   Follow-up    Pt in 10 alone Pt here for MS f/u Pt states no questions or concerns for today's visit     HISTORY OF PRESENT ILLNESS:  Austin Mclean is a 53 year old man with MS diagnosed in 04/2020.  He also has Crohn's disease.    Update  06/28/2023: His MS is doing well.  He is on Zeposia  since April 2022 and has not had any exacerbations.    MRI 12/12/2021 did not show any new lesions.  He tolerates it well.   Although he was diagnosed with MS in 2012, Zeposia  is the first medication that he has been on.  It was started because the March 2022 brain MRI showed four new lesions on the current study that were not present on the previous study from 2012.  The MRI of the cervical spine did not show any MS plaque.  The MRI of the thoracic spine showed 3 plaques consistent with MS, none present on the 2012 study.    The combination of optic neuritis, characteristics of the MRI lesions in the supratentorial, infratentorial white matter and spinal cord as well as the change over time is consistent with clinically definite multiple sclerosis.  Clinically continues to do well.  There are mild balance issues that are stable.  We discussed that these are likely due to the 3 foci in the thoracic spinal cord.    Gait is doing well and he walks several miles a day. He does not lag behind others.   He holds the bannister for safety but does not need to going downstairs.  He denies any difficulties with strength.  Sensation is usually good though he sometimes has arm numbness.  He has urinary hesitancy but no straining.  He feels he sometimes does not empty.     He is nearsighted.  He needs readers more than last year.     No  significant cognitive issues.   He has occasional word finding issues..    He has Crohn's disease but has not been on a treatment x 25 years.    He has a dry cough, even when allergies not active.   He takes Zyrtec and a nasal steroid.   No change in speech or swallowing   Denies dry mouth.     He notes some difficulty with ADD.   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  An MRI of the brain was abnormal but MRI of the spinal cord was normal.  He was told that he might have MS.  He felt the vision improved a little bit but not to baseline and he continues to have reduced vision out of that eye.  He never followed up for additional MRIs.  Since then, he denies any definite exacerbations but has had some neurologic symptoms at times.  MRI March 2022 showed 4 new lesions not present in 2012.  He started Zeposia  April 2022.  Other: He is on Xarelto for a bilateral PE 7 years ago.  He noted a possible blood clot in his leg that occurred after a long flight to [facility]  [place].   He is fairly active.   He used to be overweight but now exercises.     Imaging: MRI of the brain 05/04/2020 showed multiple T2/FLAIR hyperintense foci in the hemispheres, right thalamus, and one focus each in the cerebellum and pons.  4 of the foci in the hemispheres were not present in 2012.  MRI of the cervical spine 05/16/2020 shows a normal spinal cord and multilevel degenerative changes with mild spinal stenosis at C4-C5 through C6-C7.  The degenerative changes have progressed some compared to the 12/26/2010 MRI.  MRI of the thoracic spine 05/16/2020 showed foci within the spinal cord adjacent to T4-T5 anteriorly, centrally adjacent to T6-T7 and posterolaterally to the left and centrally adjacent to T10.  None of these foci were present on the MRI of the thoracic spine from 12/26/2010.  MRI of the brain  12/12/2021 showed no new lesions.  REVIEW OF SYSTEMS: Constitutional: No fevers, chills, sweats, or change in appetite Eyes: Persistent left visual changes.  No double vision, eye pain Ear, nose and throat: No hearing loss, ear pain, nasal congestion, sore throat Cardiovascular: No chest pain, palpitations Respiratory:  No shortness of breath at rest or with exertion.   No wheezes GastrointestinaI: No nausea, vomiting, diarrhea, abdominal pain, fecal incontinence Genitourinary:  No dysuria, urinary retention or frequency.  No nocturia. Musculoskeletal:  No neck pain, back pain Integumentary: No rash, pruritus, skin lesions Neurological: as above Psychiatric: No depression at this time.  No anxiety Endocrine: No palpitations, diaphoresis, change in appetite, change in weigh or increased thirst Hematologic/Lymphatic:  No anemia, purpura, petechiae.  He is on Eliquis for pulmonary embolism Allergic/Immunologic: No itchy/runny eyes, nasal congestion, recent allergic reactions, rashes  ALLERGIES: No Known Allergies  HOME MEDICATIONS:  Current Outpatient Medications:    buPROPion (ZYBAN) 150 MG 12 hr tablet, Take 150 mg by mouth once., Disp: , Rfl:    Cholecalciferol (VITAMIN D3) 50 MCG (2000 UT) TABS, 2 tablets, Disp: , Rfl:    Cyanocobalamin (VITAMIN B12) 1000 MCG TBCR, 1 tablet, Disp: , Rfl:    rivaroxaban (XARELTO) 10 MG TABS tablet, Take 10 mg by mouth daily., Disp: , Rfl:    vitamin C (ASCORBIC ACID) 250 MG tablet, 1 tablet, Disp: , Rfl:    ZEPOSIA  0.92 MG CAPS, TAKE 1 CAPSULE DAILY, Disp: 90 capsule, Rfl: 0   Zinc 50 MG TABS, 1 tablet, Disp: , Rfl:   PAST MEDICAL HISTORY: Past Medical History:  Diagnosis Date   Allergy    Anxiety    Colon polyps    pseudopolyps   Crohn disease (HCC)    inactive x 10 years   Optic neuritis    Pulmonary embolism (HCC) 2010   bilateral   Sleep apnea     PAST SURGICAL HISTORY: Past Surgical History:  Procedure Laterality Date    COLONOSCOPY     POLYPECTOMY      FAMILY HISTORY: Family History  Problem Relation Age of Onset   Other Father        MVA   Diabetes type I Cousin    Cancer Other        grandparent?   Colon cancer Neg Hx    Colon polyps Neg Hx    Rectal cancer Neg Hx    Stomach cancer Neg Hx    Multiple sclerosis Neg Hx     SOCIAL HISTORY:  Social History   Socioeconomic History   Marital status: Divorced    Spouse name: Not on file  Number of children: 2   Years of education: Not on file   Highest education level: Not on file  Occupational History   Occupation: Biochemist, clinical  Tobacco Use   Smoking status: Never   Smokeless tobacco: Never  Vaping Use   Vaping status: Never Used  Substance and Sexual Activity   Alcohol use: Yes    Alcohol/week: 0.0 standard drinks of alcohol    Comment: very rare   Drug use: No   Sexual activity: Not on file  Other Topics Concern   Not on file  Social History Narrative   Right handed    Pt works   Pt lives with family    Social Drivers of Corporate investment banker Strain: Not on file  Food Insecurity: Not on file  Transportation Needs: Not on file  Physical Activity: Not on file  Stress: Not on file  Social Connections: Not on file  Intimate Partner Violence: Not on file     PHYSICAL EXAM  Vitals:   06/28/23 1337  BP: 134/81  Pulse: 65  Weight: 291 lb (132 kg)  Height: 6' (1.829 m)    Body mass index is 39.47 kg/m.  Visual acuity was 20/20 OD and 20/50 OS  General: The patient is well-developed and well-nourished and in no acute distress  HEENT:  Head is [place]/AT.  Sclera are anicteric.   Skin: Extremities are without rash or  edema.  Neurologic Exam  Mental status: The patient is alert and oriented x 3 at the time of the examination. The patient has apparent normal recent and remote memory, with an apparently normal attention span and concentration ability.   Speech is normal.  Cranial nerves: Extraocular movements  are full.  He has a 1+ left APD.  Patient strength and sensation was normal.  No obvious hearing deficits are noted.  Motor:  Muscle bulk is normal.   Tone is normal. Strength is  5 / 5 in all 4 extremities.   Sensory: Sensory testing is intact to soft touch and vibration sensation in all 4 extremities.  Coordination: Cerebellar testing reveals good finger-nose-finger and heel-to-shin bilaterally.  Gait and station: Station is normal.   Gait is normal. Tandem gait minimally wide..  Romberg is negative..  Reflexes: Deep tendon reflexes are symmetric and normal bilaterally.        DIAGNOSTIC DATA (LABS, IMAGING, TESTING) - I reviewed patient records, labs, notes, testing and imaging myself where available.  Lab Results  Component Value Date   WBC 4.4 12/10/2022   HGB 16.1 12/10/2022   HCT 47.3 12/10/2022   MCV 87 12/10/2022   PLT 187 12/10/2022      Component Value Date/Time   NA 144 07/02/2022 0940   K 4.1 07/02/2022 0940   CL 104 07/02/2022 0940   CO2 26 07/02/2022 0940   GLUCOSE 100 (H) 07/02/2022 0940   GLUCOSE 103 (H) 06/17/2010 1330   BUN 12 07/02/2022 0940   CREATININE 1.20 07/02/2022 0940   CALCIUM 9.6 07/02/2022 0940   PROT 6.4 07/02/2022 0940   ALBUMIN 4.3 07/02/2022 0940   AST 20 07/02/2022 0940   ALT 31 07/02/2022 0940   ALKPHOS 47 07/02/2022 0940   BILITOT 0.6 07/02/2022 0940   GFRNONAA >60 06/17/2010 1330   GFRAA  06/17/2010 1330    >60        The eGFR has been calculated using the MDRD equation. This calculation has not been validated in all clinical situations. eGFR's persistently <60 mL/min  signify possible Chronic Kidney Disease.       ASSESSMENT AND PLAN   Multiple sclerosis (HCC) - Plan: CBC with Differential/Platelet, Comprehensive metabolic panel with GFR  High risk medication use - Plan: CBC with Differential/Platelet, Comprehensive metabolic panel with GFR  Urinary hesitancy  History of optic neuritis  Gait disturbance  1.    Continue Zeposia  for MS.  This may also help Crohn's disease.  We will check CBC with differential and CMP.   Will check MRI later in year.  If any breakthrough, will consider the anti-CD20 agents.  We discussed Kesimpta and Ocrevus in more detail.   2.    Stay active.  We discussed good diet, trying to keep in shape and taking vitamin D 3.   Return in 6 months or sooner if there are new or worsening neurologic symptoms.   Adael Culbreath A. Godwin Lat, MD, [facility] 06/28/2023, 2:28 PM Certified in Neurology, Clinical Neurophysiology, Sleep Medicine and Neuroimaging  [facility] [address] [place], [place] [address] [phone]

## 2023-06-29 ENCOUNTER — Encounter: Payer: Self-pay | Admitting: Neurology

## 2023-06-29 LAB — CBC WITH DIFFERENTIAL/PLATELET
Basophils Absolute: 0 10*3/uL (ref 0.0–0.2)
Basos: 1 %
EOS (ABSOLUTE): 0 10*3/uL (ref 0.0–0.4)
Eos: 1 %
Hematocrit: 48.1 % (ref 37.5–51.0)
Hemoglobin: 15.9 g/dL (ref 13.0–17.7)
Immature Grans (Abs): 0 10*3/uL (ref 0.0–0.1)
Immature Granulocytes: 1 %
Lymphocytes Absolute: 0.3 10*3/uL — ABNORMAL LOW (ref 0.7–3.1)
Lymphs: 9 %
MCH: 29.4 pg (ref 26.6–33.0)
MCHC: 33.1 g/dL (ref 31.5–35.7)
MCV: 89 fL (ref 79–97)
Monocytes Absolute: 0.4 10*3/uL (ref 0.1–0.9)
Monocytes: 13 %
Neutrophils Absolute: 2.5 10*3/uL (ref 1.4–7.0)
Neutrophils: 75 %
Platelets: 192 10*3/uL (ref 150–450)
RBC: 5.41 x10E6/uL (ref 4.14–5.80)
RDW: 13.5 % (ref 11.6–15.4)
WBC: 3.3 10*3/uL — ABNORMAL LOW (ref 3.4–10.8)

## 2023-06-29 LAB — COMPREHENSIVE METABOLIC PANEL WITH GFR
ALT: 24 IU/L (ref 0–44)
AST: 20 IU/L (ref 0–40)
Albumin: 4.2 g/dL (ref 4.1–5.1)
Alkaline Phosphatase: 49 IU/L (ref 44–121)
BUN/Creatinine Ratio: 12 (ref 9–20)
BUN: 15 mg/dL (ref 6–24)
Bilirubin Total: 0.5 mg/dL (ref 0.0–1.2)
CO2: 28 mmol/L (ref 20–29)
Calcium: 9.4 mg/dL (ref 8.7–10.2)
Chloride: 103 mmol/L (ref 96–106)
Creatinine, Ser: 1.24 mg/dL (ref 0.76–1.27)
Globulin, Total: 2.3 g/dL (ref 1.5–4.5)
Glucose: 82 mg/dL (ref 70–99)
Potassium: 4.6 mmol/L (ref 3.5–5.2)
Sodium: 144 mmol/L (ref 134–144)
Total Protein: 6.5 g/dL (ref 6.0–8.5)
eGFR: 71 mL/min/{1.73_m2} (ref >59)

## 2023-08-23 ENCOUNTER — Other Ambulatory Visit: Payer: Self-pay | Admitting: Neurology

## 2023-08-23 DIAGNOSIS — G35 Multiple sclerosis: Secondary | ICD-10-CM

## 2023-08-23 NOTE — Telephone Encounter (Signed)
 Last seen on 06/28/23 Follow up scheduled on 02/08/24

## 2023-10-28 ENCOUNTER — Telehealth: Payer: Self-pay

## 2023-10-28 ENCOUNTER — Other Ambulatory Visit (HOSPITAL_COMMUNITY): Payer: Self-pay

## 2023-10-28 NOTE — Telephone Encounter (Signed)
 Pharmacy Patient Advocate Encounter  Received notification from [facility] that Prior Authorization for Zeposia  has been APPROVED from 10/28/2023 to 10/27/2024   PA #/Case ID/Reference #: [id number]  XZB:[id number]

## 2024-01-18 ENCOUNTER — Ambulatory Visit: Admitting: Neurology

## 2024-02-01 ENCOUNTER — Other Ambulatory Visit: Payer: Self-pay | Admitting: Neurology

## 2024-02-01 DIAGNOSIS — G35D Multiple sclerosis, unspecified: Secondary | ICD-10-CM

## 2024-02-02 NOTE — Telephone Encounter (Signed)
 Last seen on 06/28/23 Follow up scheduled on 02/08/24

## 2024-02-08 ENCOUNTER — Ambulatory Visit: Admitting: Neurology

## 2024-09-13 ENCOUNTER — Ambulatory Visit: Admitting: Neurology
